# Patient Record
Sex: Female | Born: 1960 | Race: White | Hispanic: No | Marital: Married | Smoking: Never smoker
Health system: Southern US, Community
[De-identification: ages and names within clinical notes are randomized; demographics above are authoritative.]

## PROBLEM LIST (undated history)

## (undated) DIAGNOSIS — E7800 Pure hypercholesterolemia, unspecified: Secondary | ICD-10-CM

## (undated) DIAGNOSIS — I10 Essential (primary) hypertension: Secondary | ICD-10-CM

## (undated) DIAGNOSIS — E785 Hyperlipidemia, unspecified: Secondary | ICD-10-CM

## (undated) DIAGNOSIS — IMO0002 Reserved for concepts with insufficient information to code with codable children: Secondary | ICD-10-CM

## (undated) DIAGNOSIS — H9190 Unspecified hearing loss, unspecified ear: Secondary | ICD-10-CM

## (undated) DIAGNOSIS — M8580 Other specified disorders of bone density and structure, unspecified site: Secondary | ICD-10-CM

## (undated) HISTORY — PX: COCHLEAR IMPLANT: SUR684

## (undated) HISTORY — DX: Essential (primary) hypertension: I10

## (undated) HISTORY — DX: Hyperlipidemia, unspecified: E78.5

---

## 2007-01-16 HISTORY — PX: PLACEMENT OF BREAST IMPLANTS: SHX6334

## 2010-02-21 ENCOUNTER — Other Ambulatory Visit (HOSPITAL_COMMUNITY): Payer: Self-pay | Admitting: Obstetrics and Gynecology

## 2010-02-21 DIAGNOSIS — R922 Inconclusive mammogram: Secondary | ICD-10-CM

## 2010-02-21 DIAGNOSIS — Z803 Family history of malignant neoplasm of breast: Secondary | ICD-10-CM

## 2010-07-18 ENCOUNTER — Other Ambulatory Visit: Payer: Self-pay | Admitting: Obstetrics and Gynecology

## 2010-07-18 DIAGNOSIS — R922 Inconclusive mammogram: Secondary | ICD-10-CM

## 2010-08-17 ENCOUNTER — Ambulatory Visit
Admission: RE | Admit: 2010-08-17 | Discharge: 2010-08-17 | Disposition: A | Discharge status: Home / Self Care | Payer: 59 | Source: Ambulatory Visit | Attending: Obstetrics and Gynecology | Admitting: Obstetrics and Gynecology

## 2010-08-17 DIAGNOSIS — R922 Inconclusive mammogram: Secondary | ICD-10-CM

## 2010-08-17 DIAGNOSIS — Z803 Family history of malignant neoplasm of breast: Secondary | ICD-10-CM

## 2010-08-17 MED ORDER — GADOBENATE DIMEGLUMINE 529 MG/ML IV SOLN
10.0000 mL | Freq: Once | INTRAVENOUS | Status: AC | PRN
  Administered 2010-08-17: 10 mL via INTRAVENOUS

## 2011-05-17 ENCOUNTER — Ambulatory Visit (HOSPITAL_COMMUNITY)
Admission: RE | Admit: 2011-05-17 | Discharge: 2011-05-17 | Disposition: A | Discharge status: Home / Self Care | Payer: 59 | Source: Ambulatory Visit | Attending: Internal Medicine | Admitting: Internal Medicine

## 2011-05-17 ENCOUNTER — Encounter (HOSPITAL_COMMUNITY): Payer: Self-pay

## 2011-05-17 ENCOUNTER — Other Ambulatory Visit (HOSPITAL_COMMUNITY): Payer: Self-pay | Admitting: *Deleted

## 2011-05-17 DIAGNOSIS — M810 Age-related osteoporosis without current pathological fracture: Secondary | ICD-10-CM | POA: Insufficient documentation

## 2011-05-17 HISTORY — DX: Other specified disorders of bone density and structure, unspecified site: M85.80

## 2011-05-17 HISTORY — DX: Reserved for concepts with insufficient information to code with codable children: IMO0002

## 2011-05-17 MED ORDER — ZOLEDRONIC ACID 5 MG/100ML IV SOLN
5.0000 mg | Freq: Once | INTRAVENOUS | Status: AC
  Administered 2011-05-17: 5 mg via INTRAVENOUS
  Filled 2011-05-17: qty 100

## 2011-05-17 MED ORDER — SODIUM CHLORIDE 0.9 % IV SOLN
Freq: Once | INTRAVENOUS | Status: AC
Start: 2011-05-17 — End: 2011-05-17
  Administered 2011-05-17: 15:00:00 via INTRAVENOUS

## 2011-05-17 NOTE — Progress Notes (Signed)
Tolerated first Reclast dose well. Verbalizes understanding of d/c instructions.

## 2011-05-17 NOTE — Discharge Instructions (Signed)
Reclast Drink plenty of water the next few days. If any aches/pains take what you normally would for discomfort. Continue taking your vitamin D/calcium as directed by your MD. Call your MD if any problems or questions.    Zoledronic Acid injection (Paget's Disease, Osteoporosis) What is this medicine? ZOLEDRONIC ACID (ZOE le dron ik AS id) lowers the amount of calcium loss from bone. It is used to treat Paget's disease and osteoporosis in women. This medicine may be used for other purposes; ask your health care provider or pharmacist if you have questions. What should I tell my health care provider before I take this medicine? They need to know if you have any of these conditions: -aspirin-sensitive asthma -dental disease -kidney disease -low levels of calcium in the blood -past surgery on the parathyroid gland or intestines -an unusual or allergic reaction to zoledronic acid, other medicines, foods, dyes, or preservatives -pregnant or trying to get pregnant -breast-feeding How should I use this medicine? This medicine is for infusion into a vein. It is given by a health care professional in a hospital or clinic setting. Talk to your pediatrician regarding the use of this medicine in children. This medicine is not approved for use in children. Overdosage: If you think you have taken too much of this medicine contact a poison control center or emergency room at once. NOTE: This medicine is only for you. Do not share this medicine with others. What if I miss a dose? It is important not to miss your dose. Call your doctor or health care professional if you are unable to keep an appointment. What may interact with this medicine? -certain antibiotics given by injection -NSAIDs, medicines for pain and inflammation, like ibuprofen or naproxen -some diuretics like bumetanide, furosemide -teriparatide This list may not describe all possible interactions. Give your health care provider a list of  all the medicines, herbs, non-prescription drugs, or dietary supplements you use. Also tell them if you smoke, drink alcohol, or use illegal drugs. Some items may interact with your medicine. What should I watch for while using this medicine? Visit your doctor or health care professional for regular checkups. It may be some time before you see the benefit from this medicine. Do not stop taking your medicine unless your doctor tells you to. Your doctor may order blood tests or other tests to see how you are doing. Women should inform their doctor if they wish to become pregnant or think they might be pregnant. There is a potential for serious side effects to an unborn child. Talk to your health care professional or pharmacist for more information. You should make sure that you get enough calcium and vitamin D while you are taking this medicine. Discuss the foods you eat and the vitamins you take with your health care professional. Some people who take this medicine have severe bone, joint, and/or muscle pain. This medicine may also increase your risk for a broken thigh bone. Tell your doctor right away if you have pain in your upper leg or groin. Tell your doctor if you have any pain that does not go away or that gets worse. What side effects may I notice from receiving this medicine? Side effects that you should report to your doctor or health care professional as soon as possible: -allergic reactions like skin rash, itching or hives, swelling of the face, lips, or tongue -breathing problems -changes in vision -feeling faint or lightheaded, falls -jaw burning, cramping, or pain -muscle cramps, stiffness, or  weakness -trouble passing urine or change in the amount of urine Side effects that usually do not require medical attention (report to your doctor or health care professional if they continue or are bothersome): -bone, joint, or muscle pain -fever -irritation at site where injected -loss of  appetite -nausea, vomiting -stomach upset -tired This list may not describe all possible side effects. Call your doctor for medical advice about side effects. You may report side effects to FDA at 1-800-FDA-1088. Where should I keep my medicine? This drug is given in a hospital or clinic and will not be stored at home. NOTE: This sheet is a summary. It may not cover all possible information. If you have questions about this medicine, talk to your doctor, pharmacist, or health care provider.  2012, Elsevier/Gold Standard. (06/30/2010 9:08:15 AM)

## 2012-04-22 ENCOUNTER — Other Ambulatory Visit: Payer: Self-pay | Admitting: Obstetrics and Gynecology

## 2012-04-22 DIAGNOSIS — Z803 Family history of malignant neoplasm of breast: Secondary | ICD-10-CM

## 2012-05-07 ENCOUNTER — Ambulatory Visit
Admission: RE | Admit: 2012-05-07 | Discharge: 2012-05-07 | Disposition: A | Discharge status: Home / Self Care | Payer: 59 | Source: Ambulatory Visit | Attending: Obstetrics and Gynecology | Admitting: Obstetrics and Gynecology

## 2012-05-07 DIAGNOSIS — Z803 Family history of malignant neoplasm of breast: Secondary | ICD-10-CM

## 2012-05-07 MED ORDER — GADOBENATE DIMEGLUMINE 529 MG/ML IV SOLN
9.0000 mL | Freq: Once | INTRAVENOUS | Status: AC | PRN
  Administered 2012-05-07: 9 mL via INTRAVENOUS

## 2012-05-26 MED ORDER — ZOLEDRONIC ACID 5 MG/100ML IV SOLN: 5.0000 mg | Freq: Once | INTRAVENOUS | Status: DC

## 2012-05-27 ENCOUNTER — Ambulatory Visit (HOSPITAL_COMMUNITY)
Admission: RE | Admit: 2012-05-27 | Discharge: 2012-05-27 | Disposition: A | Discharge status: Home / Self Care | Payer: 59 | Source: Ambulatory Visit | Attending: Internal Medicine | Admitting: Internal Medicine

## 2012-05-27 ENCOUNTER — Encounter (HOSPITAL_COMMUNITY): Payer: Self-pay

## 2012-05-27 DIAGNOSIS — M949 Disorder of cartilage, unspecified: Secondary | ICD-10-CM | POA: Insufficient documentation

## 2012-05-27 DIAGNOSIS — M899 Disorder of bone, unspecified: Secondary | ICD-10-CM | POA: Insufficient documentation

## 2012-05-27 DIAGNOSIS — M810 Age-related osteoporosis without current pathological fracture: Secondary | ICD-10-CM | POA: Insufficient documentation

## 2012-05-27 HISTORY — DX: Pure hypercholesterolemia, unspecified: E78.00

## 2012-05-27 MED ORDER — ZOLEDRONIC ACID 5 MG/100ML IV SOLN
5.0000 mg | Freq: Once | INTRAVENOUS | Status: AC
  Administered 2012-05-27: 5 mg via INTRAVENOUS
  Filled 2012-05-27: qty 100

## 2012-05-27 MED ORDER — SODIUM CHLORIDE 0.9 % IV SOLN
250.0000 mL | Freq: Once | INTRAVENOUS | Status: AC
  Administered 2012-05-27: 250 mL via INTRAVENOUS

## 2012-11-14 ENCOUNTER — Ambulatory Visit (INDEPENDENT_AMBULATORY_CARE_PROVIDER_SITE_OTHER): Payer: 59

## 2012-11-14 VITALS — BP 119/78 | HR 72 | Resp 12 | Ht <= 58 in | Wt 115.0 lb

## 2012-11-14 DIAGNOSIS — L03039 Cellulitis of unspecified toe: Secondary | ICD-10-CM

## 2012-11-14 DIAGNOSIS — M79609 Pain in unspecified limb: Secondary | ICD-10-CM

## 2012-11-14 DIAGNOSIS — M2010 Hallux valgus (acquired), unspecified foot: Secondary | ICD-10-CM

## 2012-11-14 DIAGNOSIS — L600 Ingrowing nail: Secondary | ICD-10-CM

## 2012-11-14 MED ORDER — CEPHALEXIN 500 MG PO CAPS: 500.0000 mg | ORAL_CAPSULE | Freq: Three times a day (TID) | ORAL | Status: DC

## 2012-11-14 NOTE — Progress Notes (Signed)
Subjective:    Patient ID: Christina Vargas, female    DOB: [birth year], 52 y.o.   MRN: [id number]  HPI Comments: '' RT FOOT GREAT TOENAIL IS SORE FOR 2 WEEKS''  patient has several problems besides in ingrowing right great toenail medial lateral border. Patient also has notable bunion deformity bilateral right more significant than left. In the past patient was told that she should have bunion surgery as it was unsuccessful. Are patient is starting to have some discomfort as a result of her bunions. Also recently seen by dermatologist and started on oral antifungal medications for the severe onychomycosis and deformed nails she has bilateral.    Review of Systems  Constitutional: Negative.   HENT: Negative.   Eyes: Negative.   Respiratory: Negative.   Cardiovascular: Negative.   Gastrointestinal: Negative.   Endocrine: Negative.   Genitourinary: Negative.   Musculoskeletal: Negative.   Skin: Negative.   Allergic/Immunologic: Negative.   Neurological: Negative.   Hematological: Negative.   Psychiatric/Behavioral: Negative.        Objective:   Physical Exam  Vitals reviewed. Constitutional: She is oriented to person, place, and time. She appears well-developed and well-nourished.  Cardiovascular:  Pulses:      Dorsalis pedis pulses are 2+ on the right side, and 2+ on the left side.       Posterior tibial pulses are 2+ on the right side, and 2+ on the left side.  Capillary refill time 3 seconds all digits bilateral skin temperature warm turgor normal no edema noted mild varicosities noted bilateral.  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  Neurological: She is alert and oriented to person, place, and time. She has normal strength and normal reflexes.   Epicritic and proprioceptive sensations intact and symmetric bilateral. Normal plantar response and DTRs are noted.  Skin: Skin is warm and dry. No cyanosis. Nails show no clubbing.  Skin color and pigment normal hair growth absent bilateral nails extremely thickened yellow friable and discolored with cryptitis and brittleness. Patient is significant incurvation of the right hallux nail with edema and erythema the borders and tenderness on palpation consistent with early paronychia. Patient also keratoses pinch callus and first MTP area bilateral  Psychiatric: She has a normal mood and affect. Her behavior is normal.          Assessment & Plan:  Assessments at this time #1 ingrowing nail right hallux with onychomycosis and early paronychia right great toe. Plan at this time is for AP nail procedure with phenol matricectomy. Local anesthetic block is administered total of 3 cc 50-50 mixture of 2% Xylocaine plain and 0.5% Marcaine plain in a digital block fashion hemostasis was applied obtained with the Coflex wrap. The medial and lateral borders were excised phenol matricectomy applied x3 to each border followed by alcohol wash Silvadene cream and a gauze dressing being applied. Patient is given instructions for daily Betadine warm water soak prescription for cephalexin 500 mg 3 times a day x10 days recommended Tylenol or Advil as needed for pain. Recheck in 2-3 weeks for followup for nail check.  Bunion deformity bilateral: Patient does have notable bunion deformities with some dorsal displacement second digits and possible early capsulitis symptomology. Dispensed literature for future consideration of surgical correction. Patient is advised earlier correction may prevent degenerative changes from developing an increase your  success rate. The leg surgery increase his risk of arthritis development and reduces the success rate of surgical intervention.  Patient was also requested debridement of the  remaining mycotic nails which were provided debridement at this time also suggested topical Fungi-Nail to the affected nails daily which can be obtained over-the-counter. Patient may also be candidate for a Nuvail, or topical urea preparations for her nails to assist in improving the nail dystrophy during her oral treatment as well.  Patient will followup to 3 weeks for further discussions of nail treatment and bunion options.  Alvan Dame DPM

## 2012-11-14 NOTE — Patient Instructions (Signed)

## 2012-12-05 ENCOUNTER — Ambulatory Visit (INDEPENDENT_AMBULATORY_CARE_PROVIDER_SITE_OTHER): Payer: 59

## 2012-12-05 ENCOUNTER — Ambulatory Visit: Payer: 59

## 2012-12-05 VITALS — BP 107/68 | HR 83 | Resp 18

## 2012-12-05 DIAGNOSIS — L600 Ingrowing nail: Secondary | ICD-10-CM

## 2012-12-05 DIAGNOSIS — M79609 Pain in unspecified limb: Secondary | ICD-10-CM

## 2012-12-05 DIAGNOSIS — Z09 Encounter for follow-up examination after completed treatment for conditions other than malignant neoplasm: Secondary | ICD-10-CM

## 2012-12-05 NOTE — Progress Notes (Signed)
  Subjective:    Patient ID: Christina Vargas, female    DOB: 05-29-1960, 52 y.o.   MRN: [id number]  HPImy nail is good on my right big toenail and is a little red and puffy and my bunion hurts with my heels    Review of Systems     Objective:   Physical Exam No new findings the nail borders mediolateral have slight eschar no discharge or drainage no pain slight erythema noted consistent with postop course assessment good postop progress at this time patient discharged from our care with followup in the future to further discuss her bunion problems on an as-needed basis.       Assessment & Plan:  Good postop progress following AP nail procedure right great toe patient discharged from our care at this time good resolution following AP nail eschar should followup with the next month. Next  Alvan Dame DPM

## 2012-12-05 NOTE — Patient Instructions (Signed)

## 2013-01-05 ENCOUNTER — Other Ambulatory Visit: Payer: Self-pay | Admitting: Obstetrics and Gynecology

## 2013-01-05 DIAGNOSIS — Z803 Family history of malignant neoplasm of breast: Secondary | ICD-10-CM

## 2013-01-20 ENCOUNTER — Ambulatory Visit
Admission: RE | Admit: 2013-01-20 | Discharge: 2013-01-20 | Disposition: A | Discharge status: Home / Self Care | Payer: 59 | Source: Ambulatory Visit | Attending: Obstetrics and Gynecology | Admitting: Obstetrics and Gynecology

## 2013-01-20 DIAGNOSIS — Z803 Family history of malignant neoplasm of breast: Secondary | ICD-10-CM

## 2013-01-20 MED ORDER — GADOBENATE DIMEGLUMINE 529 MG/ML IV SOLN
10.0000 mL | Freq: Once | INTRAVENOUS | Status: AC | PRN
  Administered 2013-01-20: 10 mL via INTRAVENOUS

## 2014-01-14 ENCOUNTER — Telehealth: Payer: Self-pay | Admitting: Genetic Counselor

## 2014-01-14 NOTE — Telephone Encounter (Signed)
S/W PATIENT AND GAVE GENETIC APPT FOR 01/07 @ 1:30 W/GENETIC COUNSELOR

## 2014-01-21 ENCOUNTER — Other Ambulatory Visit: Payer: Managed Care, Other (non HMO)

## 2014-01-21 ENCOUNTER — Ambulatory Visit (HOSPITAL_BASED_OUTPATIENT_CLINIC_OR_DEPARTMENT_OTHER): Payer: Managed Care, Other (non HMO) | Admitting: Genetic Counselor

## 2014-01-21 DIAGNOSIS — Z806 Family history of leukemia: Secondary | ICD-10-CM

## 2014-01-21 DIAGNOSIS — Z803 Family history of malignant neoplasm of breast: Secondary | ICD-10-CM | POA: Insufficient documentation

## 2014-01-21 DIAGNOSIS — Z315 Encounter for genetic counseling: Secondary | ICD-10-CM

## 2014-01-21 DIAGNOSIS — Z8041 Family history of malignant neoplasm of ovary: Secondary | ICD-10-CM | POA: Insufficient documentation

## 2014-01-21 NOTE — Progress Notes (Signed)
[facility] New Patient Visit  REFERRING PROVIDER: Tivis Ringer, MD [address] [place], [place] [address]  PRIMARY PROVIDER:  Tivis Ringer, MD  PRIMARY REASON FOR VISIT:  Family history of breast cancer  HISTORY OF PRESENT ILLNESS:   Christina Vargas, a 54 y.o. female, was seen for a [place] cancer genetics consultation at the request of Dr. Dagmar Hait due to a family history of cancer.  Ms. Clement Husbands presents to clinic today to discuss the possibility of a hereditary predisposition to cancer, genetic testing, and to further clarify her future cancer risks, as well as potential cancer risks for family members. She has no personal history of cancer and is in good general health.   HORMONAL RISK FACTORS:  Menarche was at age 50 .  First live birth at age 31.  OCP use for approximately 0 years.  Ovaries intact: yes.  Hysterectomy: no.  Menopausal status: perimenopausal.  HRT use: 0 years.  CANCER SCREENING:  Colonoscopy: yes; normal. Mammogram within the last year: yes. Number of breast biopsies: 0. Up to date with pelvic exams:  yes.  Past Medical History  Diagnosis Date   Degenerative disk disease    Hypercholesteremia    Osteopenia     Past Surgical History  Procedure Laterality Date   Cochlear implant      left ear    History   Social History   Marital Status: Married    Spouse Name: N/A    Number of Children: N/A   Years of Education: N/A   Social History Main Topics   Smoking status: Never Smoker    Smokeless tobacco: Not on file   Alcohol Use: Yes   Drug Use: No   Sexual Activity: Not on file   Other Topics Concern   Not on file   Social History Narrative     FAMILY HISTORY:  During the visit, a 4-generation pedigree was obtained. A copy of the pedigree with be scanned into Epic under the Media tab. Significant family history diagnoses include the following: Family History  Problem Relation Age  of Onset   Cancer Mother 47    breast   Cancer Sister 68    dysplastic nevi on leg   Cancer Brother 60    dysplastic nevi or basal cell on back   Cancer Maternal Aunt 70    breast   Cancer Sister 19    chondrosarcoma   Cancer Other     niece with possible thyroid ca and nephew with brain tumor ? cancerous (these are siblings related to patient through patient's sister with chondrosarcoma)   Cancer Other     two first cousins once removed with leukemia in their 17s (related to patient through mat aunt with breast cancer)   [name] ancestry is of [place] descent on her maternal side of the family and Ashkenazi Jewish on her paternal side of the family.   GENETIC COUNSELING ASSESSMENT:  Ms. Clement Husbands is a 54 y.o. female with a family history of cancer suggestive of a hereditary predisposition to cancer. We, therefore, discussed and recommended the following at today's visit.   DISCUSSION:  We reviewed the characteristics, features and inheritance patterns of hereditary cancer syndromes. We also discussed genetic testing, including the appropriate family members to test, the process of testing, insurance coverage and turn-around-time for results. We discussed the implications of a negative, positive and/or variant of uncertain significant result. Given the extensive and varied family history of cancer  on the maternal side of the family, we recommended [name] pursue genetic testing for the CancerNext-Expanded gene panel.   PLAN:  Based on our above recommendation, Ms. Clement Husbands wished to pursue genetic testing and the blood sample was drawn and will be sent to [facility] for analysis. Results should be available within approximately 6 weeks time, at which point they will be disclosed by telephone to [name], as will any additional recommendations warranted by these results. Lastly, we encouraged [name] to remain in contact with cancer genetics annually so that we  can continuously update the family history and inform her of any changes in cancer genetics and testing that may be of benefit for this family.   Ms.  Inetta Fermo questions were answered to her satisfaction today. Our contact information was provided should additional questions or concerns arise. Thank you for the referral and allowing [place] to share in the care of your patient.   Catherine A. Fine, MS, CGC Certified Psychologist, [email or web address] phone: [phone]  The patient was seen for a total of 55 minutes in face-to-face genetic counseling.  This patient was discussed with Dr. Jana Hakim who agrees with the above.    ______________________________________________________________________ For Office Staff:  Number of people involved in session including genetic counselor: 2 Was an intern or student involved with case: not applicable

## 2014-02-17 ENCOUNTER — Encounter: Payer: Self-pay | Admitting: Genetic Counselor

## 2014-02-17 DIAGNOSIS — Z803 Family history of malignant neoplasm of breast: Secondary | ICD-10-CM

## 2014-02-17 DIAGNOSIS — Z8041 Family history of malignant neoplasm of ovary: Secondary | ICD-10-CM

## 2014-02-17 NOTE — Progress Notes (Signed)
[facility]  Genetic Test Results     PRIMARY PROVIDER:  Tivis Ringer, MD  PRIMARY REASON FOR VISIT:  Family history of breast cancer  GENETIC TEST RESULT:  Testing Laboratory: [facility]  Test Ordered: CancerNext-Expanded gene panel Date of Report: 02/16/14 Result: Normal, no pathogenic mutations identified General Interpretation: Reassuring  HPI: Christina Vargas was previously seen in the [facility] due to concerns regarding a hereditary predisposition to cancer. Please refer to our prior cancer genetics clinic note for more information regarding Christina Vargas's medical, social and family histories, and our assessment and recommendations, at the time. Christina Vargas genetic test results and recommendations warranted by these results were recently disclosed to her and are discussed in more detail below.  GENETIC TEST RESULTS: At the time of Christina Vargas's visit, we recommended she pursue genetic testing, which includes sequencing and deletion/duplication analysis of several genes associated with an increased risk for cancer via a gene panel. The CancerNext-Expanded gene panel offered by [facility] analyzes the following 49 genes: APC, ATM, BAP1, BARD1, BMPR1A, BRCA1, BRCA2, BRIP1, CDH1, CDK4, CDKN2A, CHEK2, EPCAM, FH, FLCN, GREM1, MAX, MEN1, MITF, MET, MLH1, MRE11A, MSH2, MSH6, MUTYH, NBN, NF1, PALB2, PMS2, POLD1, POLE, PTEN, RAD50, RAD51C, RAD51D, RET, SDHA, SDHAF2, SDHB, SDHC, SDHD, SMAD4, SMARCA4, STK11, TMEM127, TP53, TSC1, TSC2, and VHL. Genetic testing for this gene panel was normal and did not reveal a pathogenic mutation in any of these genes. A copy of the genetic test report will be scanned into Epic under the media tab.   CANCER SCREENING RECOMMENDATIONS: This normal result is reassuring and indicates that Christina Vargas does not likely have an increased risk of cancer due to a mutation in one of these genes.  We,  therefore, recommended  Christina Vargas continue to follow the cancer screening guidelines provided by her primary healthcare providers.   RECOMMENDATIONS FOR FAMILY MEMBERS:  While these results are reassuring for Christina Vargas, this test does not tell us anything about Christina Vargas's siblings' or maternal realtives' risks. We recommended these relatives also have genetic counseling and testing if they have not already done so, and are happy to help facilitate testing and/or a referral if needed. Cancer genetic counselors can also be located, by visiting the website of the [employer] ([email or web address]) and Field seismologist for a Oncologist by zip code.    FOLLOW-UP: Lastly, we discussed with Christina Vargas that cancer genetics is a rapidly advancing field and it is likely that new genetic tests will be appropriate for her and/or family members in the future. We encouraged her to remain in contact with cancer genetics on an annual basis so we can update her personal and family histories and let her know of advances in cancer genetics that may benefit this family.   Our contact number was provided. Christina Vargas questions were answered to her satisfaction, and she knows she is welcome to call us at anytime with additional questions or concerns.    Catherine A. Fine, MS, CGC Certified Psychologist, [email or web address] Phone: [phone]

## 2014-06-21 ENCOUNTER — Ambulatory Visit (HOSPITAL_COMMUNITY)
Admission: RE | Admit: 2014-06-21 | Discharge: 2014-06-21 | Disposition: A | Discharge status: Home / Self Care | Payer: 59 | Source: Ambulatory Visit | Attending: Internal Medicine | Admitting: Internal Medicine

## 2014-06-21 ENCOUNTER — Encounter (HOSPITAL_COMMUNITY): Payer: Self-pay

## 2014-06-21 DIAGNOSIS — M810 Age-related osteoporosis without current pathological fracture: Secondary | ICD-10-CM | POA: Diagnosis not present

## 2014-06-21 MED ORDER — SODIUM CHLORIDE 0.9 % IV SOLN
250.0000 mL | Freq: Once | INTRAVENOUS | Status: AC
  Administered 2014-06-21: 250 mL via INTRAVENOUS

## 2014-06-21 MED ORDER — ZOLEDRONIC ACID 5 MG/100ML IV SOLN
5.0000 mg | Freq: Once | INTRAVENOUS | Status: AC
  Administered 2014-06-21: 5 mg via INTRAVENOUS
  Filled 2014-06-21: qty 100

## 2014-06-21 NOTE — Discharge Instructions (Signed)
RECLAST °Zoledronic Acid injection (Paget's Disease, Osteoporosis) °What is this medicine? °ZOLEDRONIC ACID (ZOE le dron ik AS id) lowers the amount of calcium loss from bone. It is used to treat Paget's disease and osteoporosis in women. °This medicine may be used for other purposes; ask your health care provider or pharmacist if you have questions. °COMMON BRAND NAME(S): Reclast, Zometa °What should I tell my health care provider before I take this medicine? °They need to know if you have any of these conditions: °-aspirin-sensitive asthma °-cancer, especially if you are receiving medicines used to treat cancer °-dental disease or wear dentures °-infection °-kidney disease °-low levels of calcium in the blood °-past surgery on the parathyroid gland or intestines °-receiving corticosteroids like dexamethasone or prednisone °-an unusual or allergic reaction to zoledronic acid, other medicines, foods, dyes, or preservatives °-pregnant or trying to get pregnant °-breast-feeding °How should I use this medicine? °This medicine is for infusion into a vein. It is given by a health care professional in a hospital or clinic setting. °Talk to your pediatrician regarding the use of this medicine in children. This medicine is not approved for use in children. °Overdosage: If you think you have taken too much of this medicine contact a poison control center or emergency room at once. °NOTE: This medicine is only for you. Do not share this medicine with others. °What if I miss a dose? °It is important not to miss your dose. Call your doctor or health care professional if you are unable to keep an appointment. °What may interact with this medicine? °-certain antibiotics given by injection °-NSAIDs, medicines for pain and inflammation, like ibuprofen or naproxen °-some diuretics like bumetanide, furosemide °-teriparatide °This list may not describe all possible interactions. Give your health care provider a list of all the  medicines, herbs, non-prescription drugs, or dietary supplements you use. Also tell them if you smoke, drink alcohol, or use illegal drugs. Some items may interact with your medicine. °What should I watch for while using this medicine? °Visit your doctor or health care professional for regular checkups. It may be some time before you see the benefit from this medicine. Do not stop taking your medicine unless your doctor tells you to. Your doctor may order blood tests or other tests to see how you are doing. °Women should inform their doctor if they wish to become pregnant or think they might be pregnant. There is a potential for serious side effects to an unborn child. Talk to your health care professional or pharmacist for more information. °You should make sure that you get enough calcium and vitamin D while you are taking this medicine. Discuss the foods you eat and the vitamins you take with your health care professional. °Some people who take this medicine have severe bone, joint, and/or muscle pain. This medicine may also increase your risk for jaw problems or a broken thigh bone. Tell your doctor right away if you have severe pain in your jaw, bones, joints, or muscles. Tell your doctor if you have any pain that does not go away or that gets worse. °Tell your dentist and dental surgeon that you are taking this medicine. You should not have major dental surgery while on this medicine. See your dentist to have a dental exam and fix any dental problems before starting this medicine. Take good care of your teeth while on this medicine. Make sure you see your dentist for regular follow-up appointments. °What side effects may I notice from receiving this   medicine? Side effects that you should report to your doctor or health care professional as soon as possible: -allergic reactions like skin rash, itching or hives, swelling of the face, lips, or tongue -anxiety, confusion, or depression -breathing  problems -changes in vision -eye pain -feeling faint or lightheaded, falls -jaw pain, especially after dental work -mouth sores -muscle cramps, stiffness, or weakness -trouble passing urine or change in the amount of urine Side effects that usually do not require medical attention (report to your doctor or health care professional if they continue or are bothersome): -bone, joint, or muscle pain -constipation -diarrhea -fever -hair loss -irritation at site where injected -loss of appetite -nausea, vomiting -stomach upset -trouble sleeping -trouble swallowing -weak or tired This list may not describe all possible side effects. Call your doctor for medical advice about side effects. You may report side effects to FDA at 1-800-FDA-1088. Where should I keep my medicine? This drug is given in a hospital or clinic and will not be stored at home. NOTE: This sheet is a summary. It may not cover all possible information. If you have questions about this medicine, talk to your doctor, pharmacist, or health care provider.  2015, Elsevier/Gold Standard. (2012-06-16 10:03:48)

## 2015-08-05 ENCOUNTER — Ambulatory Visit (HOSPITAL_COMMUNITY): Payer: Managed Care, Other (non HMO)

## 2016-01-04 ENCOUNTER — Ambulatory Visit (HOSPITAL_COMMUNITY)
Admission: RE | Admit: 2016-01-04 | Discharge: 2016-01-04 | Disposition: A | Discharge status: Home / Self Care | Payer: 59 | Source: Ambulatory Visit | Attending: Internal Medicine | Admitting: Internal Medicine

## 2016-01-04 ENCOUNTER — Encounter (HOSPITAL_COMMUNITY): Payer: Self-pay

## 2016-01-04 DIAGNOSIS — M810 Age-related osteoporosis without current pathological fracture: Secondary | ICD-10-CM | POA: Insufficient documentation

## 2016-01-04 HISTORY — DX: Unspecified hearing loss, unspecified ear: H91.90

## 2016-01-04 MED ORDER — SODIUM CHLORIDE 0.9 % IV SOLN
Freq: Once | INTRAVENOUS | Status: AC
  Administered 2016-01-04: 11:00:00 via INTRAVENOUS

## 2016-01-04 MED ORDER — ZOLEDRONIC ACID 5 MG/100ML IV SOLN
5.0000 mg | Freq: Once | INTRAVENOUS | Status: AC
  Administered 2016-01-04: 5 mg via INTRAVENOUS
  Filled 2016-01-04: qty 100

## 2016-01-04 NOTE — Discharge Instructions (Signed)
Zoledronic Acid injection (Paget's Disease, Osteoporosis) °What is this medicine? °ZOLEDRONIC ACID (ZOE le dron ik AS id) lowers the amount of calcium loss from bone. It is used to treat Paget's disease and osteoporosis in women. °COMMON BRAND NAME(S): Reclast, Zometa °What should I tell my health care provider before I take this medicine? °They need to know if you have any of these conditions: °-aspirin-sensitive asthma °-cancer, especially if you are receiving medicines used to treat cancer °-dental disease or wear dentures °-infection °-kidney disease °-low levels of calcium in the blood °-past surgery on the parathyroid gland or intestines °-receiving corticosteroids like dexamethasone or prednisone °-an unusual or allergic reaction to zoledronic acid, other medicines, foods, dyes, or preservatives °-pregnant or trying to get pregnant °-breast-feeding °How should I use this medicine? °This medicine is for infusion into a vein. It is given by a health care professional in a hospital or clinic setting. °Talk to your pediatrician regarding the use of this medicine in children. This medicine is not approved for use in children. °What if I miss a dose? °It is important not to miss your dose. Call your doctor or health care professional if you are unable to keep an appointment. °What may interact with this medicine? °-certain antibiotics given by injection °-NSAIDs, medicines for pain and inflammation, like ibuprofen or naproxen °-some diuretics like bumetanide, furosemide °-teriparatide °What should I watch for while using this medicine? °Visit your doctor or health care professional for regular checkups. It may be some time before you see the benefit from this medicine. Do not stop taking your medicine unless your doctor tells you to. Your doctor may order blood tests or other tests to see how you are doing. °Women should inform their doctor if they wish to become pregnant or think they might be pregnant. There is a  potential for serious side effects to an unborn child. Talk to your health care professional or pharmacist for more information. °You should make sure that you get enough calcium and vitamin D while you are taking this medicine. Discuss the foods you eat and the vitamins you take with your health care professional. °Some people who take this medicine have severe bone, joint, and/or muscle pain. This medicine may also increase your risk for jaw problems or a broken thigh bone. Tell your doctor right away if you have severe pain in your jaw, bones, joints, or muscles. Tell your doctor if you have any pain that does not go away or that gets worse. °Tell your dentist and dental surgeon that you are taking this medicine. You should not have major dental surgery while on this medicine. See your dentist to have a dental exam and fix any dental problems before starting this medicine. Take good care of your teeth while on this medicine. Make sure you see your dentist for regular follow-up appointments. °What side effects may I notice from receiving this medicine? °Side effects that you should report to your doctor or health care professional as soon as possible: °-allergic reactions like skin rash, itching or hives, swelling of the face, lips, or tongue °-anxiety, confusion, or depression °-breathing problems °-changes in vision °-eye pain °-feeling faint or lightheaded, falls °-jaw pain, especially after dental work °-mouth sores °-muscle cramps, stiffness, or weakness °-redness, blistering, peeling or loosening of the skin, including inside the mouth °-trouble passing urine or change in the amount of urine °Side effects that usually do not require medical attention (report to your doctor or health care professional if   they continue or are bothersome): °-bone, joint, or muscle pain °-constipation °-diarrhea °-fever °-hair loss °-irritation at site where injected °-loss of appetite °-nausea, vomiting °-stomach  upset °-trouble sleeping °-trouble swallowing °-weak or tired °Where should I keep my medicine? °This drug is given in a hospital or clinic and will not be stored at home. °© 2017 Elsevier/Gold Standard (2013-05-30 14:19:57) ° °

## 2016-02-06 ENCOUNTER — Other Ambulatory Visit: Payer: Self-pay | Admitting: Obstetrics and Gynecology

## 2016-02-06 DIAGNOSIS — R928 Other abnormal and inconclusive findings on diagnostic imaging of breast: Secondary | ICD-10-CM

## 2016-02-08 ENCOUNTER — Ambulatory Visit
Admission: RE | Admit: 2016-02-08 | Discharge: 2016-02-08 | Disposition: A | Discharge status: Home / Self Care | Payer: 59 | Source: Ambulatory Visit | Attending: Obstetrics and Gynecology | Admitting: Obstetrics and Gynecology

## 2016-02-08 DIAGNOSIS — R928 Other abnormal and inconclusive findings on diagnostic imaging of breast: Secondary | ICD-10-CM

## 2016-03-21 DIAGNOSIS — J019 Acute sinusitis, unspecified: Secondary | ICD-10-CM | POA: Diagnosis not present

## 2016-03-21 DIAGNOSIS — Z6826 Body mass index (BMI) 26.0-26.9, adult: Secondary | ICD-10-CM | POA: Diagnosis not present

## 2016-03-21 DIAGNOSIS — R05 Cough: Secondary | ICD-10-CM | POA: Diagnosis not present

## 2016-04-18 ENCOUNTER — Encounter: Payer: Self-pay | Admitting: Podiatry

## 2016-04-18 ENCOUNTER — Telehealth: Payer: Self-pay | Admitting: *Deleted

## 2016-04-18 ENCOUNTER — Ambulatory Visit (INDEPENDENT_AMBULATORY_CARE_PROVIDER_SITE_OTHER): Payer: BLUE CROSS/BLUE SHIELD

## 2016-04-18 ENCOUNTER — Ambulatory Visit (INDEPENDENT_AMBULATORY_CARE_PROVIDER_SITE_OTHER): Payer: BLUE CROSS/BLUE SHIELD | Admitting: Podiatry

## 2016-04-18 VITALS — BP 109/72 | HR 71

## 2016-04-18 DIAGNOSIS — M779 Enthesopathy, unspecified: Secondary | ICD-10-CM | POA: Diagnosis not present

## 2016-04-18 DIAGNOSIS — D3610 Benign neoplasm of peripheral nerves and autonomic nervous system, unspecified: Secondary | ICD-10-CM | POA: Diagnosis not present

## 2016-04-18 DIAGNOSIS — R52 Pain, unspecified: Secondary | ICD-10-CM

## 2016-04-18 MED ORDER — MELOXICAM 15 MG PO TABS: 15.0000 mg | ORAL_TABLET | Freq: Every day | ORAL | 0 refills | Status: DC

## 2016-04-18 NOTE — Addendum Note (Signed)
Addended byDeidre Ala, Oneida Mckamey L on: 04/18/2016 02:44 PM   Modules accepted: Orders

## 2016-04-18 NOTE — Telephone Encounter (Signed)
[facility] states pt is waiting for rx to be called by doctor she saw today. I called Mobic ordered by Dr. Prudence Davidson to [facility].

## 2016-04-18 NOTE — Progress Notes (Signed)
   Subjective:    Patient ID: Christina Vargas, female    DOB: 10/27/1960, 56 y.o.   MRN: [id number]  HPI this patient presents the office with chief complaint of pain in her right forefoot. She says that she has been increasing her exercise and she believes that she aggravated her foot performing these exercises. She says that the pain has been increasing over the last 3 weeks, but she has been unable to be scheduled to be seen. She presents the office today stating that she is swollen and painful on the top of her right forefoot. She says there is localized area of swelling noted at the site. She has provided no self treatment nor sought any professional help. She presents the office today for an evaluation and treatment of this condition    Review of Systems  All other systems reviewed and are negative.      Objective:   Physical Exam GENERAL APPEARANCE: Alert, conversant. Appropriately groomed. No acute distress.  VASCULAR: Pedal pulses are  palpable at  [facility] and PT bilateral.  Capillary refill time is immediate to all digits,  Normal temperature gradient.  Digital hair growth is present bilateral  NEUROLOGIC: sensation is normal to 5.07 monofilament at 5/5 sites bilateral.  Light touch is intact bilateral, Muscle strength normal.  MUSCULOSKELETAL: acceptable muscle strength, tone and stability bilateral.  Intrinsic muscluature intact bilateral.  Rectus appearance of foot and digits noted bilateral. Palpable pain 2,3 MPJ right foot.  Swelling noted second interspace and over her MPJ.  No pain noted upon DF and PF right foot.  Severe HAV deformities both feet.  DERMATOLOGIC: skin color, texture, and turgor are within normal limits.  No preulcerative lesions or ulcers  are seen, no interdigital maceration noted.  No open lesions present.  Digital nails are asymptomatic. No drainage noted.         Assessment & Plan:  Capsulitis 2,3 right foot.  Neuroma second interspace right foot.  IE   Xrays taken reveal no pathology except severe HAV formation. Patient was treated with injection therapy at the office. She was also prescribed Mobic 15 mg #30, as well as a surgical shoe [place] leaving the [place] and no follow-up appointment will be made. She was told to "return to the office if the problem persists upon her return.   Gardiner Barefoot DPM

## 2016-07-10 DIAGNOSIS — R05 Cough: Secondary | ICD-10-CM | POA: Diagnosis not present

## 2016-08-22 DIAGNOSIS — N950 Postmenopausal bleeding: Secondary | ICD-10-CM | POA: Diagnosis not present

## 2016-08-22 DIAGNOSIS — N941 Unspecified dyspareunia: Secondary | ICD-10-CM | POA: Diagnosis not present

## 2016-08-24 DIAGNOSIS — M859 Disorder of bone density and structure, unspecified: Secondary | ICD-10-CM | POA: Diagnosis not present

## 2016-08-24 DIAGNOSIS — E782 Mixed hyperlipidemia: Secondary | ICD-10-CM | POA: Diagnosis not present

## 2016-08-24 DIAGNOSIS — Z0000 Encounter for general adult medical examination without abnormal findings: Secondary | ICD-10-CM | POA: Diagnosis not present

## 2016-08-30 DIAGNOSIS — Q761 Klippel-Feil syndrome: Secondary | ICD-10-CM | POA: Diagnosis not present

## 2016-08-30 DIAGNOSIS — M859 Disorder of bone density and structure, unspecified: Secondary | ICD-10-CM | POA: Diagnosis not present

## 2016-08-30 DIAGNOSIS — N950 Postmenopausal bleeding: Secondary | ICD-10-CM | POA: Diagnosis not present

## 2016-08-30 DIAGNOSIS — Z1389 Encounter for screening for other disorder: Secondary | ICD-10-CM | POA: Diagnosis not present

## 2016-08-30 DIAGNOSIS — Z8249 Family history of ischemic heart disease and other diseases of the circulatory system: Secondary | ICD-10-CM | POA: Diagnosis not present

## 2016-08-30 DIAGNOSIS — Z0000 Encounter for general adult medical examination without abnormal findings: Secondary | ICD-10-CM | POA: Diagnosis not present

## 2016-08-30 DIAGNOSIS — E782 Mixed hyperlipidemia: Secondary | ICD-10-CM | POA: Diagnosis not present

## 2016-08-31 DIAGNOSIS — Z1212 Encounter for screening for malignant neoplasm of rectum: Secondary | ICD-10-CM | POA: Diagnosis not present

## 2016-10-30 DIAGNOSIS — M5030 Other cervical disc degeneration, unspecified cervical region: Secondary | ICD-10-CM | POA: Diagnosis not present

## 2016-10-30 DIAGNOSIS — M7581 Other shoulder lesions, right shoulder: Secondary | ICD-10-CM | POA: Diagnosis not present

## 2016-11-07 ENCOUNTER — Ambulatory Visit (INDEPENDENT_AMBULATORY_CARE_PROVIDER_SITE_OTHER): Payer: BLUE CROSS/BLUE SHIELD | Admitting: Neurology

## 2016-11-07 ENCOUNTER — Encounter: Payer: Self-pay | Admitting: Neurology

## 2016-11-07 VITALS — BP 110/64 | HR 74 | Ht <= 58 in | Wt 122.1 lb

## 2016-11-07 DIAGNOSIS — M4802 Spinal stenosis, cervical region: Secondary | ICD-10-CM | POA: Diagnosis not present

## 2016-11-07 DIAGNOSIS — R202 Paresthesia of skin: Secondary | ICD-10-CM | POA: Diagnosis not present

## 2016-11-07 DIAGNOSIS — Q761 Klippel-Feil syndrome: Secondary | ICD-10-CM | POA: Diagnosis not present

## 2016-11-07 MED ORDER — DIAZEPAM 5 MG PO TABS: ORAL_TABLET | ORAL | 0 refills | Status: DC

## 2016-11-07 NOTE — Patient Instructions (Signed)
1. MRI cervical spine without contrast 2. NCS/EMG of the arms 3. Please call with the name and dose of the muscle relaxant that you have  We will call you with the results of your testing and decide the next step

## 2016-11-07 NOTE — Progress Notes (Signed)
[facility] Clinic Note - Initial Visit   Date: 11/07/16  Christina Vargas MRN: [id number] DOB: 05/10/1960   Dear Dr. Dagmar Hait:  Thank you for your kind referral of Christina Vargas for consultation of cervical canal stenosis. Although her history is well known to you, please allow [place] to reiterate it for the purpose of our medical record. The patient was accompanied to the clinic by self.   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  .   Starting around her 27s, she began having neck pain ans since then has had chronic issues with neck and arm pain.  Pain initially starts in the head/neck and has burning/stabbing pain over the shoulder and radiates into her arms.  There is also severe achy pain and stiffness of the neck.  Pain is somewhat improved by manually holding the chin up and with traction. She has known diffuse cervical spondylosis involving the C4-5, C5-6, and C6-7 levels which is followed by Dr. Sherwood Gambler, who has recommended surgery due to cervical instability.  She had tried neck physiotherapy and muscle relaxants, which have had variable benefit.  Over the past several years, she has noticed progressive pain and weakness of the arms.  She has greater difficulty with painting for prolonged periods because of neck pain and arms more fatigued.  Of note, she also has right shoulder discomfort and has subacromial injection for this which helped.   She has intermittent paresthesias of the upper extremity, especially numbness of the hands, which can wake her up from sleeping.  Out-side paper records, electronic medical record, and images have been reviewed where available and summarized as:  MRI cervical  spine wo contrast 09/25/2010:   1.  C4-C5 moderate central canal stenosis secondary to left paracentral disc extrusion.  Bilateral left greater than right foraminal stenosis. 2.  C5-C6 mild to moderate central canal stenosis secondary to broad-based disc osteophyte complex.  Left greater than right bilateral foraminal stenosis secondary to uncovertebral spurring. 3.  C6-C7 bilateral mild to moderate foraminal stenosis secondary to uncovertebral spurring. 4.  Ankylosis of C2-3 and T1-2.  2 mm anterolisthesis of T2-1, T3 is degenerative.  T2-T3 facet arthrosis account for anterolisthesis.  Past Medical History:  Diagnosis Date  . Hyperlipidemia   . Hypertension    Past surgical history Inner ear procedure due to left hearing deficits  Medications:  Outpatient Encounter Prescriptions as of 11/07/2016  Medication Sig  . pravastatin (PRAVACHOL) 40 MG tablet Take 40 mg by mouth daily.  . predniSONE (STERAPRED UNI-PAK 48 TAB) 10 MG (48) TBPK tablet Take by mouth daily.  . diazepam (VALIUM) 5 MG tablet Take 1 tablet 30-min prior to MRI.  Ok to repeat at facility if needed.   No facility-administered encounter medications on file as of 11/07/2016.     Allergies: No Known Allergies  Family History: Family History  Problem Relation Age of Onset  . Breast cancer Mother   . Heart disease Father   . Cancer Sister     Social History: Social History  Substance Use Topics  . Smoking status: Never Smoker  . Smokeless tobacco: Never Used  . Alcohol use Yes   Social History   Social History Narrative   Lives with husband in a 2 story home.  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  Education: college.    Review of Systems:  CONSTITUTIONAL: No fevers, chills, night sweats, or weight loss.   EYES: No visual changes or eye pain ENT: No hearing changes.  No history of nose bleeds.   RESPIRATORY: No cough, wheezing and shortness of breath.   CARDIOVASCULAR: Negative for chest pain, and  palpitations.   GI: Negative for abdominal discomfort, blood in stools or black stools.  No recent change in bowel habits.   GU:  No history of incontinence.   MUSCLOSKELETAL: +history of joint pain or swelling.  No myalgias.   SKIN: Negative for lesions, rash, and itching.   HEMATOLOGY/ONCOLOGY: Negative for prolonged bleeding, bruising easily, and swollen nodes.  No history of cancer.   ENDOCRINE: Negative for cold or heat intolerance, polydipsia or goiter.   PSYCH:  No depression or anxiety symptoms.   NEURO: As Above.   Vital Signs:  BP 110/64   Pulse 74   Ht 4\' 10"  (1.473 m)   Wt 122 lb 2 oz (55.4 kg)   SpO2 99%   BMI 25.52 kg/m   General Medical Exam:   General:  Well appearing, comfortable.   Eyes/ENT: see cranial nerve examination.   Neck: No masses appreciated.  Full range of motion without tenderness.  No carotid bruits. Respiratory:  Clear to auscultation, good air entry bilaterally.   Cardiac:  Regular rate and rhythm, no murmur.   Extremities:  No deformities, edema, or skin discoloration.  Skin:  No rashes or lesions.  Neurological Exam: MENTAL STATUS including orientation to time, place, person, recent and remote memory, attention span and concentration, language, and fund of knowledge is normal.  Speech is not dysarthric.  CRANIAL NERVES: II:  No visual field defects.  Unremarkable fundi.   III-IV-VI: Pupils equal round and reactive to light.  Normal conjugate, extra-ocular eye movements in all directions of gaze.  No nystagmus.  No ptosis.   V:  Normal facial sensation.   VII:  Normal facial symmetry and movements.  No pathologic facial reflexes.  VIII:  Normal hearing and vestibular function.   IX-X:  Normal palatal movement.   XI:  Normal shoulder shrug and head rotation.   XII:  Normal tongue strength and range of motion, no deviation or fasciculation.  MOTOR:  No atrophy, fasciculations or abnormal movements.  No pronator drift.  Tone is normal.     Right Upper Extremity:    Left Upper Extremity:    Deltoid  5/5   Deltoid  5/5   Biceps  5/5   Biceps  5/5   Triceps  5/5   Triceps  5/5   Wrist extensors  5/5   Wrist extensors  5/5   Wrist flexors  5/5   Wrist flexors  5/5   Finger extensors  5/5   Finger extensors  5/5   Finger flexors  5/5   Finger flexors  5/5   Dorsal interossei  5/5   Dorsal interossei  5/5   Abductor pollicis  5/5   Abductor pollicis  5/5   Tone (Ashworth scale)  0  Tone (Ashworth scale)  0   Right Lower Extremity:    Left Lower Extremity:    Hip flexors  5/5   Hip flexors  5/5   Hip extensors  5/5   Hip extensors  5/5   Knee flexors  5/5   Knee flexors  5/5   Knee extensors  5/5   Knee extensors  5/5  Dorsiflexors  5/5   Dorsiflexors  5/5   Plantarflexors  5/5   Plantarflexors  5/5   Toe extensors  5/5   Toe extensors  5/5   Toe flexors  5/5   Toe flexors  5/5   Tone (Ashworth scale)  0  Tone (Ashworth scale)  0   MSRs:  Right                                                                 Left brachioradialis 3+  brachioradialis 3+  biceps 2+  biceps 2+  triceps 2+  triceps 2+  patellar 2+  patellar 2+  ankle jerk 2+  ankle jerk 2+  Hoffman no  Hoffman no  plantar response down  plantar response down   SENSORY:  Normal and symmetric perception of light touch, pinprick, vibration, and proprioception.  Romberg's sign absent.   COORDINATION/GAIT: Normal finger-to- nose-finger and heel-to-shin.  Intact rapid alternating movements bilaterally.  Able to rise from a chair without using arms.  Gait narrow based and stable.    IMPRESSION: Christina Vargas is a delightful 56 year-old female with Klippel Feil syndrome and hyperlipidemia referred for evaluation of cervical canal stenosis.  I personally viewed her MRI cervical spine from 2012 which showed multilevel degenerative changes and spinal stenosis, especially at C4-5, C5-6, and C6-7.  There is no signal cord changes or cord deformity. I had a lengthy  discussion with patient regarding management options, which includes conservative therapy with muscle relaxants, neck PT, epidural steroid injection, or surgical decompression and fusion.  With her progressive neck pain and arm fatigue, I would like to get an update MRI of the cervical region, which would better guide management options.  She was informed that conservative therapies are only symptomatic and the goal of surgery is to alleviate pain and prevent neurological injury.    Regarding her hand paresthesias, this may be related to carpal tunnel syndrome.  NCS/EMG will be ordered to determine whether symptoms are due to nerve entrapment or cervical radiculopathy.  She most likely also has overlapping right shoulder arthritis which is contributing to her arm fatigue and pain and will follow-up with Dr. Alfonso Ramus for this.   PLAN/RECOMMENDATIONS:  1.  NCS/EMG upper extremities 2.  MRI cervical spine wo contrast 3.  Patient to call with name of muscle relaxant which she has at home so I can offer an alternative medication. 4.  Continue neck home exericses  Further recommendations will be based on the results of her testing   Thank you for allowing me to participate in patient's care.  If I can answer any additional questions, I would be pleased to do so.    Sincerely,    Rodnesha Elie K. Posey Pronto, DO

## 2016-11-22 ENCOUNTER — Telehealth: Payer: Self-pay | Admitting: Neurology

## 2016-11-22 ENCOUNTER — Ambulatory Visit (INDEPENDENT_AMBULATORY_CARE_PROVIDER_SITE_OTHER): Payer: BLUE CROSS/BLUE SHIELD | Admitting: Neurology

## 2016-11-22 DIAGNOSIS — M4802 Spinal stenosis, cervical region: Secondary | ICD-10-CM

## 2016-11-22 DIAGNOSIS — G5603 Carpal tunnel syndrome, bilateral upper limbs: Secondary | ICD-10-CM

## 2016-11-22 NOTE — Telephone Encounter (Signed)
Results of EMG discussed with patient which shows mild bilateral carpal tunnel syndrome. Recommend conservative therapy with using a wrist splint then night, and minimize hyperflexion at the wrist.  MRI cervical spine is pending to evaluate for any worsening spinal canal stenosis causing her neck pain, upper arm pain, and fatigue.   She also complains of right shoulder pain, which did not respond to methocarbamol/NSAIDS.  She had a steroid injection which helped alleviate her discomfort some. She is seeing Dr. Alfonso Ramus for this and I recommend follow-up and discussion with the further imaging of the shoulder is indicated.   [name] K. Posey Pronto, DO

## 2016-11-22 NOTE — Procedures (Signed)
[facility]  [address], [place]  [place], [place] [address] Tel: [phone] Fax:  [phone] Test Date:  11/22/2016  Patient: Christina Vargas DOB: 05/10/1960 Physician: Narda Amber, DO  Sex: Female Height: 4\' 10"  Ref Phys: Narda Amber, DO  ID#: [id number] Temp: 35.1C Technician:    Patient Complaints: This is a 56 year old female referred for evaluation of chronic neck pain and bilateral hand paresthesias.  NCV & EMG Findings: Extensive electrodiagnostic testing of the right upper extremity and additional studies of the left shows:  1. Bilateral median and ulnar sensory responses are within normal limits. Bilateral mixed palmar sensory responses are abnormal. 2. Bilateral median and ulnar motor responses are within normal limits. Of note, the left median motor response shows anomalous innervation to the abductor pollicis brevis, as noted by a motor response when stimulating at the ulnar wrist, consistent with a Martin-Gruber anastomosis. 3. No evidence of active or chronic motor axon loss changes affecting any of the tested muscles. Motor unit configuration and recruitment pattern is within normal limits.  Impression: 1. Bilateral median neuropathy at or distal to the wrist, consistent with the clinical diagnosis of carpal tunnel syndrome. Overall, these findings are mild in degree electrically. 2. 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   ___________________________ Narda Amber, DO    Nerve Conduction Studies Anti Sensory Summary Table   Site NR Peak (ms) Norm Peak (ms) P-T Amp (V) Norm P-T Amp  Left Median Anti Sensory (2nd Digit)  35.1C  Wrist    3.1 <3.6 37.5 >15  Right Median Anti Sensory (2nd Digit)  Wrist    2.8 <3.6 38.0 >15  Left Ulnar Anti Sensory (5th Digit)  35.1C  Wrist    2.5 <3.1 34.6 >10  Right Ulnar Anti Sensory (5th Digit)  Wrist    2.2 <3.1 34.9 >10   Motor Summary Table   Site NR Onset  (ms) Norm Onset (ms) O-P Amp (mV) Norm O-P Amp Site1 Site2 Delta-0 (ms) Dist (cm) Vel (m/s) Norm Vel (m/s)  Left Median Motor (Abd Poll Brev)  35.1C  Wrist    3.5 <4.0 8.7 >6 Elbow Wrist 4.3 24.0 56 >50  Elbow    7.8  9.8  Ulnar-wrist crossover Elbow 4.1 0.0    Ulnar-wrist crossover    3.7  3.4         Right Median Motor (Abd Poll Brev)  Wrist    2.6 <4.0 8.9 >6 Elbow Wrist 4.4 24.0 55 >50  Elbow    7.0  8.3         Left Ulnar Motor (Abd Dig Minimi)  35.1C  Wrist    2.1 <3.1 7.9 >7 B Elbow Wrist 3.0 21.0 70 >50  B Elbow    5.1  6.5  A Elbow B Elbow 1.7 10.0 59 >50  A Elbow    6.8  6.2         Right Ulnar Motor (Abd Dig Minimi)  35.1C  Wrist    1.7 <3.1 7.8 >7 B Elbow Wrist 3.1 20.5 66 >50  B Elbow    4.8  7.6  A Elbow B Elbow 1.7 10.0 59 >50  A Elbow    6.5  6.8          Comparison Summary Table   Site NR Peak (ms) Norm Peak (ms) P-T Amp (V) Site1 Site2 Delta-P (ms) Norm Delta (ms)  Left Median/Ulnar Palm Comparison (Wrist - 8cm)  35.1C  Median [employer]  2.0 <2.2 47.9 Median Palm Ulnar Palm 0.7   Ulnar Palm    1.3 <2.2 31.5      Right Median/Ulnar Palm Comparison (Wrist - 8cm)  35.1C  Median Palm    1.7 <2.2 72.5 Median Palm Ulnar Palm 0.4   Ulnar Palm    1.3 <2.2 21.4       EMG   Side Muscle Ins Act Fibs Psw Fasc Number Recrt Dur Dur. Amp Amp. Poly Poly. Comment  Right 1stDorInt Nml Nml Nml Nml Nml Nml Nml Nml Nml Nml Nml Nml N/A  Right Abd Poll Brev Nml Nml Nml Nml Nml Nml Nml Nml Nml Nml Nml Nml N/A  Right PronatorTeres Nml Nml Nml Nml Nml Nml Nml Nml Nml Nml Nml Nml N/A  Right Biceps Nml Nml Nml Nml Nml Nml Nml Nml Nml Nml Nml Nml N/A  Right Triceps Nml Nml Nml Nml Nml Nml Nml Nml Nml Nml Nml Nml N/A  Right Deltoid Nml Nml Nml Nml Nml Nml Nml Nml Nml Nml Nml Nml N/A  Left 1stDorInt Nml Nml Nml Nml Nml Nml Nml Nml Nml Nml Nml Nml N/A  Left Abd Poll Brev Nml Nml Nml Nml Nml Nml Nml Nml Nml Nml Nml Nml N/A  Left PronatorTeres Nml Nml Nml Nml Nml Nml Nml Nml Nml Nml Nml Nml  N/A  Left Biceps Nml Nml Nml Nml Nml Nml Nml Nml Nml Nml Nml Nml N/A  Left Triceps Nml Nml Nml Nml Nml Nml Nml Nml Nml Nml Nml Nml N/A  Left Deltoid Nml Nml Nml Nml Nml Nml Nml Nml Nml Nml Nml Nml N/A  Left 1stDorInt Nml Nml Nml Nml Nml Nml Nml Nml Nml Nml Nml Nml N/A      Waveforms:

## 2016-11-29 ENCOUNTER — Other Ambulatory Visit: Payer: Self-pay | Admitting: *Deleted

## 2016-11-29 DIAGNOSIS — R202 Paresthesia of skin: Secondary | ICD-10-CM

## 2016-11-29 DIAGNOSIS — M4802 Spinal stenosis, cervical region: Secondary | ICD-10-CM

## 2016-11-30 ENCOUNTER — Telehealth: Payer: Self-pay | Admitting: *Deleted

## 2016-11-30 NOTE — Telephone Encounter (Signed)
MRI PA #: [id number],  Valid: 11-16 to 12-15.

## 2016-12-04 DIAGNOSIS — M4802 Spinal stenosis, cervical region: Secondary | ICD-10-CM | POA: Diagnosis not present

## 2016-12-11 ENCOUNTER — Telehealth: Payer: Self-pay | Admitting: Neurology

## 2016-12-11 NOTE — Telephone Encounter (Signed)
All records faxed

## 2016-12-11 NOTE — Telephone Encounter (Signed)
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 The bony detail is difficult on MRI and could be better evaluated on CT to confirm levels of degenerative disease. The vertebral body levels are labeled on series 3 and series 4 of this study.   1. Advanced multilevel degenerative changes, detailed above, result in moderate canal stenosis at C4-C5 and C5-C6. There is moderate to advanced neural foraminal stenosis at C3-4 through C6-7.  2. Edema within the C5 and C6 vertebral bodies is favored degenerative in etiology. A small amount of prevertebral edema at C4-C6 may also be related to the advanced degenerative disc changes and osteophytes at these levels. 3. Incomplete segmentation of C2-C3 and T1-T2. 4. 2-3 mm anterolisthesis of T2-T3 with facet hypertrophy and small facet joint effusions at that level. 5. Focal lesion in the T5 vertebral body is indeterminate but statistically likely to be a benign hemangioma or venous malformation in the absence of known malignancy.   The above results were discussed with patient.  I have requested CD images to be forward to me to review personally.  By comparing this report to the MRI cervical spine from 2012, there has been progressive multilevel degenerative changes and foraminal stenosis.  Canal stenosis is moderate at C4-5 and slightly worse at C5-6.  Recommend follow-up with her neurosurgeon, Dr. Sherwood Gambler, to discuss these findings as well as surgical consideration.  I have asked her to take the CD of her recent MRI along with her visit.   Donika K. Posey Pronto, DO

## 2017-01-21 DIAGNOSIS — M5030 Other cervical disc degeneration, unspecified cervical region: Secondary | ICD-10-CM | POA: Diagnosis not present

## 2017-01-21 DIAGNOSIS — M4722 Other spondylosis with radiculopathy, cervical region: Secondary | ICD-10-CM | POA: Diagnosis not present

## 2017-01-21 DIAGNOSIS — M5416 Radiculopathy, lumbar region: Secondary | ICD-10-CM | POA: Diagnosis not present

## 2017-01-21 DIAGNOSIS — M5412 Radiculopathy, cervical region: Secondary | ICD-10-CM | POA: Diagnosis not present

## 2017-01-21 DIAGNOSIS — Q761 Klippel-Feil syndrome: Secondary | ICD-10-CM | POA: Diagnosis not present

## 2017-02-28 ENCOUNTER — Ambulatory Visit (INDEPENDENT_AMBULATORY_CARE_PROVIDER_SITE_OTHER): Payer: BLUE CROSS/BLUE SHIELD

## 2017-02-28 ENCOUNTER — Encounter: Payer: Self-pay | Admitting: Podiatry

## 2017-02-28 ENCOUNTER — Other Ambulatory Visit: Payer: Self-pay | Admitting: Podiatry

## 2017-02-28 ENCOUNTER — Ambulatory Visit: Payer: BLUE CROSS/BLUE SHIELD | Admitting: Podiatry

## 2017-02-28 VITALS — BP 111/76 | HR 61 | Resp 16

## 2017-02-28 DIAGNOSIS — M778 Other enthesopathies, not elsewhere classified: Secondary | ICD-10-CM

## 2017-02-28 DIAGNOSIS — M779 Enthesopathy, unspecified: Principal | ICD-10-CM

## 2017-02-28 DIAGNOSIS — M7752 Other enthesopathy of left foot: Secondary | ICD-10-CM

## 2017-02-28 MED ORDER — MELOXICAM 15 MG PO TABS: 15.0000 mg | ORAL_TABLET | Freq: Every day | ORAL | 3 refills | Status: DC

## 2017-02-28 MED ORDER — METHYLPREDNISOLONE 4 MG PO TBPK: ORAL_TABLET | ORAL | 0 refills | Status: DC

## 2017-02-28 NOTE — Progress Notes (Signed)
  Subjective:  Patient ID: Christina Vargas, female    DOB: 1960-09-21,  MRN: [id number] HPI Chief Complaint  Patient presents with  . Foot Pain    Dorsal forefoot and midfoot left - aching x 3 weeks, some swelling, no injury, wedge type shoes feel the best, tried Advil PRN-some help  Patient is going to [place] on March 3rd.  57 y.o. female presents with the above complaint.     Past Medical History:  Diagnosis Date  . Hyperlipidemia   . Hypertension      Current Outpatient Medications:  .  meloxicam (MOBIC) 7.5 MG tablet, , Disp: , Rfl: 2 .  pravastatin (PRAVACHOL) 40 MG tablet, Take 40 mg by mouth daily., Disp: , Rfl:   No Known Allergies Review of Systems  All other systems reviewed and are negative.  Objective:   Vitals:   02/28/17 0829  BP: 111/76  Pulse: 61  Resp: 16    General: Well developed, nourished, in no acute distress, alert and oriented x3   Dermatological: Skin is warm, dry and supple bilateral. Nails x 10 are well maintained; remaining integument appears unremarkable at this time. There are no open sores, no preulcerative lesions, no rash or signs of infection present.  Vascular: Dorsalis Pedis artery and Posterior Tibial artery pedal pulses are 2/4 bilateral with immedate capillary fill time. Pedal hair growth present. No varicosities and no lower extremity edema present bilateral.   Neruologic: Grossly intact via light touch bilateral. Vibratory intact via tuning fork bilateral. Protective threshold with Semmes Wienstein monofilament intact to all pedal sites bilateral. Patellar and Achilles deep tendon reflexes 2+ bilateral. No Babinski or clonus noted bilateral.   Musculoskeletal: No gross boney pedal deformities bilateral. No pain, crepitus, or limitation noted with foot and ankle range of motion bilateral. Muscular strength 5/5 in all groups tested bilateral.  Pain to the dorsal aspect of the left foot tarsometatarsal joints and just distal to that  area.  Gait: Unassisted, Nonantalgic.    Radiographs:  Taken today demonstrate only soft tissue swelling dorsal aspect of the foot.  No fractures identified.  Assessment & Plan:   Assessment: Capsulitis dorsal aspect left foot.  Plan: After sterile Betadine skin prep injected the area today with Kenalog and local anesthetic consisting 20 mg of Kenalog 5 mg Marcaine point of maximal tenderness.  Tolerated procedure well without complications.     Ajani Rineer T. [place], [place]

## 2017-03-05 ENCOUNTER — Telehealth: Payer: Self-pay | Admitting: Podiatry

## 2017-03-05 NOTE — Telephone Encounter (Signed)
I'm a pt of Dr. Stephenie Acres. I'm on my fourth day on the medrol Dosepak. As of the second and third day my face is flushed and it kind of looks like I have a sunburn. Yesterday my heart was really beating a lot. I stopped taking the medicine because I thinks its a reaction. I wanted to understand what I should do now. Please call me on my cell at [phone]. Thank you.

## 2017-03-05 NOTE — Telephone Encounter (Signed)
I spoke with pt and she states she also has a headache. I told pt that on occasion pts have an intolerance to steroid, with the symptom she is having, and told her to take benadryl for the flushing and cool cloths to the areas, tylenol for the headache if she tolerated that, and begin the meloxicam for the foot pain. Pt states understanding.

## 2017-03-13 DIAGNOSIS — Z01419 Encounter for gynecological examination (general) (routine) without abnormal findings: Secondary | ICD-10-CM | POA: Diagnosis not present

## 2017-03-13 DIAGNOSIS — Z6824 Body mass index (BMI) 24.0-24.9, adult: Secondary | ICD-10-CM | POA: Diagnosis not present

## 2017-03-13 DIAGNOSIS — Z1231 Encounter for screening mammogram for malignant neoplasm of breast: Secondary | ICD-10-CM | POA: Diagnosis not present

## 2017-03-15 DIAGNOSIS — R002 Palpitations: Secondary | ICD-10-CM | POA: Diagnosis not present

## 2017-03-15 DIAGNOSIS — Z6824 Body mass index (BMI) 24.0-24.9, adult: Secondary | ICD-10-CM | POA: Diagnosis not present

## 2017-03-15 DIAGNOSIS — E7849 Other hyperlipidemia: Secondary | ICD-10-CM | POA: Diagnosis not present

## 2017-03-15 DIAGNOSIS — Z8249 Family history of ischemic heart disease and other diseases of the circulatory system: Secondary | ICD-10-CM | POA: Diagnosis not present

## 2017-04-11 DIAGNOSIS — R05 Cough: Secondary | ICD-10-CM | POA: Diagnosis not present

## 2017-04-11 DIAGNOSIS — J019 Acute sinusitis, unspecified: Secondary | ICD-10-CM | POA: Diagnosis not present

## 2017-04-11 DIAGNOSIS — R197 Diarrhea, unspecified: Secondary | ICD-10-CM | POA: Diagnosis not present

## 2017-04-11 DIAGNOSIS — Z6824 Body mass index (BMI) 24.0-24.9, adult: Secondary | ICD-10-CM | POA: Diagnosis not present

## 2017-04-29 DIAGNOSIS — J0100 Acute maxillary sinusitis, unspecified: Secondary | ICD-10-CM | POA: Diagnosis not present

## 2017-04-29 DIAGNOSIS — J029 Acute pharyngitis, unspecified: Secondary | ICD-10-CM | POA: Diagnosis not present

## 2017-04-29 DIAGNOSIS — R05 Cough: Secondary | ICD-10-CM | POA: Diagnosis not present

## 2017-04-29 DIAGNOSIS — R52 Pain, unspecified: Secondary | ICD-10-CM | POA: Diagnosis not present

## 2017-05-08 DIAGNOSIS — Z13820 Encounter for screening for osteoporosis: Secondary | ICD-10-CM | POA: Diagnosis not present

## 2017-06-20 DIAGNOSIS — E785 Hyperlipidemia, unspecified: Secondary | ICD-10-CM | POA: Diagnosis not present

## 2017-06-20 DIAGNOSIS — Z8249 Family history of ischemic heart disease and other diseases of the circulatory system: Secondary | ICD-10-CM | POA: Diagnosis not present

## 2017-06-20 DIAGNOSIS — R0789 Other chest pain: Secondary | ICD-10-CM | POA: Diagnosis not present

## 2017-06-24 DIAGNOSIS — R0789 Other chest pain: Secondary | ICD-10-CM | POA: Diagnosis not present

## 2017-06-28 DIAGNOSIS — E785 Hyperlipidemia, unspecified: Secondary | ICD-10-CM | POA: Diagnosis not present

## 2017-06-28 DIAGNOSIS — Z8249 Family history of ischemic heart disease and other diseases of the circulatory system: Secondary | ICD-10-CM | POA: Diagnosis not present

## 2017-06-28 DIAGNOSIS — R0789 Other chest pain: Secondary | ICD-10-CM | POA: Diagnosis not present

## 2017-09-25 DIAGNOSIS — Z0000 Encounter for general adult medical examination without abnormal findings: Secondary | ICD-10-CM | POA: Diagnosis not present

## 2017-09-25 DIAGNOSIS — M859 Disorder of bone density and structure, unspecified: Secondary | ICD-10-CM | POA: Diagnosis not present

## 2017-09-25 DIAGNOSIS — E782 Mixed hyperlipidemia: Secondary | ICD-10-CM | POA: Diagnosis not present

## 2017-10-01 DIAGNOSIS — M8580 Other specified disorders of bone density and structure, unspecified site: Secondary | ICD-10-CM | POA: Diagnosis not present

## 2017-10-01 DIAGNOSIS — Z0000 Encounter for general adult medical examination without abnormal findings: Secondary | ICD-10-CM | POA: Diagnosis not present

## 2017-10-01 DIAGNOSIS — Z1389 Encounter for screening for other disorder: Secondary | ICD-10-CM | POA: Diagnosis not present

## 2017-10-01 DIAGNOSIS — E782 Mixed hyperlipidemia: Secondary | ICD-10-CM | POA: Diagnosis not present

## 2017-10-02 DIAGNOSIS — Z1212 Encounter for screening for malignant neoplasm of rectum: Secondary | ICD-10-CM | POA: Diagnosis not present

## 2018-01-01 DIAGNOSIS — Z23 Encounter for immunization: Secondary | ICD-10-CM | POA: Diagnosis not present

## 2018-10-14 ENCOUNTER — Other Ambulatory Visit: Payer: Self-pay

## 2018-10-14 DIAGNOSIS — R6889 Other general symptoms and signs: Secondary | ICD-10-CM | POA: Diagnosis not present

## 2018-10-14 DIAGNOSIS — Z20822 Contact with and (suspected) exposure to covid-19: Secondary | ICD-10-CM

## 2018-10-15 LAB — NOVEL CORONAVIRUS, NAA: SARS-CoV-2, NAA: NOT DETECTED

## 2018-10-17 DIAGNOSIS — E559 Vitamin D deficiency, unspecified: Secondary | ICD-10-CM | POA: Diagnosis not present

## 2018-10-17 DIAGNOSIS — E782 Mixed hyperlipidemia: Secondary | ICD-10-CM | POA: Diagnosis not present

## 2018-10-17 DIAGNOSIS — Z0000 Encounter for general adult medical examination without abnormal findings: Secondary | ICD-10-CM | POA: Diagnosis not present

## 2018-10-24 DIAGNOSIS — Q761 Klippel-Feil syndrome: Secondary | ICD-10-CM | POA: Diagnosis not present

## 2018-10-24 DIAGNOSIS — Z0000 Encounter for general adult medical examination without abnormal findings: Secondary | ICD-10-CM | POA: Diagnosis not present

## 2018-10-24 DIAGNOSIS — Z1331 Encounter for screening for depression: Secondary | ICD-10-CM | POA: Diagnosis not present

## 2018-10-24 DIAGNOSIS — Z8249 Family history of ischemic heart disease and other diseases of the circulatory system: Secondary | ICD-10-CM | POA: Diagnosis not present

## 2018-10-24 DIAGNOSIS — M8580 Other specified disorders of bone density and structure, unspecified site: Secondary | ICD-10-CM | POA: Diagnosis not present

## 2018-10-24 DIAGNOSIS — E782 Mixed hyperlipidemia: Secondary | ICD-10-CM | POA: Diagnosis not present

## 2018-10-27 DIAGNOSIS — Z1212 Encounter for screening for malignant neoplasm of rectum: Secondary | ICD-10-CM | POA: Diagnosis not present

## 2018-10-30 DIAGNOSIS — Z1231 Encounter for screening mammogram for malignant neoplasm of breast: Secondary | ICD-10-CM | POA: Diagnosis not present

## 2018-10-30 DIAGNOSIS — Z01419 Encounter for gynecological examination (general) (routine) without abnormal findings: Secondary | ICD-10-CM | POA: Diagnosis not present

## 2018-10-30 DIAGNOSIS — Z6824 Body mass index (BMI) 24.0-24.9, adult: Secondary | ICD-10-CM | POA: Diagnosis not present

## 2018-11-18 DIAGNOSIS — N941 Unspecified dyspareunia: Secondary | ICD-10-CM | POA: Diagnosis not present

## 2018-11-18 DIAGNOSIS — M6281 Muscle weakness (generalized): Secondary | ICD-10-CM | POA: Diagnosis not present

## 2018-11-18 DIAGNOSIS — R350 Frequency of micturition: Secondary | ICD-10-CM | POA: Diagnosis not present

## 2018-11-18 DIAGNOSIS — R351 Nocturia: Secondary | ICD-10-CM | POA: Diagnosis not present

## 2018-12-02 DIAGNOSIS — R197 Diarrhea, unspecified: Secondary | ICD-10-CM | POA: Diagnosis not present

## 2018-12-02 DIAGNOSIS — R351 Nocturia: Secondary | ICD-10-CM | POA: Diagnosis not present

## 2018-12-02 DIAGNOSIS — R350 Frequency of micturition: Secondary | ICD-10-CM | POA: Diagnosis not present

## 2018-12-02 DIAGNOSIS — N941 Unspecified dyspareunia: Secondary | ICD-10-CM | POA: Diagnosis not present

## 2018-12-03 ENCOUNTER — Other Ambulatory Visit: Payer: Self-pay

## 2018-12-03 DIAGNOSIS — Z20822 Contact with and (suspected) exposure to covid-19: Secondary | ICD-10-CM

## 2018-12-05 LAB — NOVEL CORONAVIRUS, NAA: SARS-CoV-2, NAA: NOT DETECTED

## 2018-12-23 DIAGNOSIS — Z03818 Encounter for observation for suspected exposure to other biological agents ruled out: Secondary | ICD-10-CM | POA: Diagnosis not present

## 2019-01-13 DIAGNOSIS — Z20828 Contact with and (suspected) exposure to other viral communicable diseases: Secondary | ICD-10-CM | POA: Diagnosis not present

## 2019-04-03 DIAGNOSIS — F419 Anxiety disorder, unspecified: Secondary | ICD-10-CM | POA: Diagnosis not present

## 2019-04-13 DIAGNOSIS — F419 Anxiety disorder, unspecified: Secondary | ICD-10-CM | POA: Diagnosis not present

## 2019-04-20 DIAGNOSIS — F419 Anxiety disorder, unspecified: Secondary | ICD-10-CM | POA: Diagnosis not present

## 2019-04-29 DIAGNOSIS — F419 Anxiety disorder, unspecified: Secondary | ICD-10-CM | POA: Diagnosis not present

## 2019-05-04 DIAGNOSIS — F419 Anxiety disorder, unspecified: Secondary | ICD-10-CM | POA: Diagnosis not present

## 2019-05-18 DIAGNOSIS — F419 Anxiety disorder, unspecified: Secondary | ICD-10-CM | POA: Diagnosis not present

## 2019-05-21 DIAGNOSIS — Z13820 Encounter for screening for osteoporosis: Secondary | ICD-10-CM | POA: Diagnosis not present

## 2019-05-25 DIAGNOSIS — F419 Anxiety disorder, unspecified: Secondary | ICD-10-CM | POA: Diagnosis not present

## 2019-06-09 DIAGNOSIS — H524 Presbyopia: Secondary | ICD-10-CM | POA: Diagnosis not present

## 2019-06-10 DIAGNOSIS — F419 Anxiety disorder, unspecified: Secondary | ICD-10-CM | POA: Diagnosis not present

## 2019-06-24 DIAGNOSIS — F419 Anxiety disorder, unspecified: Secondary | ICD-10-CM | POA: Diagnosis not present

## 2019-07-09 DIAGNOSIS — F419 Anxiety disorder, unspecified: Secondary | ICD-10-CM | POA: Diagnosis not present

## 2019-07-10 DIAGNOSIS — L259 Unspecified contact dermatitis, unspecified cause: Secondary | ICD-10-CM | POA: Diagnosis not present

## 2019-07-10 DIAGNOSIS — R5383 Other fatigue: Secondary | ICD-10-CM | POA: Diagnosis not present

## 2019-07-10 DIAGNOSIS — R0781 Pleurodynia: Secondary | ICD-10-CM | POA: Diagnosis not present

## 2019-07-10 DIAGNOSIS — L298 Other pruritus: Secondary | ICD-10-CM | POA: Diagnosis not present

## 2019-07-11 ENCOUNTER — Emergency Department (HOSPITAL_COMMUNITY)
Admission: EM | Admit: 2019-07-11 | Discharge: 2019-07-11 | Disposition: A | Discharge status: Home / Self Care | Payer: BC Managed Care – PPO | Attending: Emergency Medicine | Admitting: Emergency Medicine

## 2019-07-11 ENCOUNTER — Encounter (HOSPITAL_COMMUNITY): Payer: Self-pay

## 2019-07-11 ENCOUNTER — Emergency Department (HOSPITAL_COMMUNITY): Payer: BC Managed Care – PPO

## 2019-07-11 ENCOUNTER — Other Ambulatory Visit: Payer: Self-pay

## 2019-07-11 DIAGNOSIS — M545 Low back pain: Secondary | ICD-10-CM | POA: Insufficient documentation

## 2019-07-11 DIAGNOSIS — M546 Pain in thoracic spine: Secondary | ICD-10-CM | POA: Insufficient documentation

## 2019-07-11 DIAGNOSIS — R109 Unspecified abdominal pain: Secondary | ICD-10-CM | POA: Diagnosis not present

## 2019-07-11 DIAGNOSIS — R21 Rash and other nonspecific skin eruption: Secondary | ICD-10-CM | POA: Insufficient documentation

## 2019-07-11 DIAGNOSIS — D259 Leiomyoma of uterus, unspecified: Secondary | ICD-10-CM | POA: Diagnosis not present

## 2019-07-11 DIAGNOSIS — R110 Nausea: Secondary | ICD-10-CM | POA: Insufficient documentation

## 2019-07-11 DIAGNOSIS — R10812 Left upper quadrant abdominal tenderness: Secondary | ICD-10-CM | POA: Diagnosis not present

## 2019-07-11 DIAGNOSIS — R1013 Epigastric pain: Secondary | ICD-10-CM | POA: Insufficient documentation

## 2019-07-11 DIAGNOSIS — R748 Abnormal levels of other serum enzymes: Secondary | ICD-10-CM | POA: Diagnosis not present

## 2019-07-11 DIAGNOSIS — Z7982 Long term (current) use of aspirin: Secondary | ICD-10-CM | POA: Diagnosis not present

## 2019-07-11 DIAGNOSIS — I10 Essential (primary) hypertension: Secondary | ICD-10-CM | POA: Insufficient documentation

## 2019-07-11 DIAGNOSIS — I878 Other specified disorders of veins: Secondary | ICD-10-CM | POA: Diagnosis not present

## 2019-07-11 DIAGNOSIS — M549 Dorsalgia, unspecified: Secondary | ICD-10-CM | POA: Diagnosis not present

## 2019-07-11 LAB — COMPREHENSIVE METABOLIC PANEL
ALT: 23 U/L (ref 0–44)
AST: 18 U/L (ref 15–41)
Albumin: 4.5 g/dL (ref 3.5–5.0)
Alkaline Phosphatase: 46 U/L (ref 38–126)
Anion gap: 10 (ref 5–15)
BUN: 19 mg/dL (ref 6–20)
CO2: 24 mmol/L (ref 22–32)
Calcium: 9 mg/dL (ref 8.9–10.3)
Chloride: 99 mmol/L (ref 98–111)
Creatinine, Ser: 0.81 mg/dL (ref 0.44–1.00)
GFR calc Af Amer: >60 mL/min (ref >60)
GFR calc non Af Amer: >60 mL/min (ref >60)
Glucose, Bld: 118 mg/dL — ABNORMAL HIGH (ref 70–99)
Potassium: 3.7 mmol/L (ref 3.5–5.1)
Sodium: 133 mmol/L — ABNORMAL LOW (ref 135–145)
Total Bilirubin: 0.6 mg/dL (ref 0.3–1.2)
Total Protein: 7.2 g/dL (ref 6.5–8.1)

## 2019-07-11 LAB — URINALYSIS, ROUTINE W REFLEX MICROSCOPIC
Bilirubin Urine: NEGATIVE
Glucose, UA: NEGATIVE mg/dL
Hgb urine dipstick: NEGATIVE
Ketones, ur: NEGATIVE mg/dL
Leukocytes,Ua: NEGATIVE
Nitrite: NEGATIVE
Protein, ur: NEGATIVE mg/dL
Specific Gravity, Urine: 1.008 (ref 1.005–1.030)
pH: 6 (ref 5.0–8.0)

## 2019-07-11 LAB — LIPASE, BLOOD: Lipase: 83 U/L — ABNORMAL HIGH (ref 11–51)

## 2019-07-11 LAB — CBC
HCT: 41.1 % (ref 36.0–46.0)
Hemoglobin: 13.3 g/dL (ref 12.0–15.0)
MCH: 28.7 pg (ref 26.0–34.0)
MCHC: 32.4 g/dL (ref 30.0–36.0)
MCV: 88.8 fL (ref 80.0–100.0)
Platelets: 319 10*3/uL (ref 150–400)
RBC: 4.63 MIL/uL (ref 3.87–5.11)
RDW: 13.2 % (ref 11.5–15.5)
WBC: 8 10*3/uL (ref 4.0–10.5)
nRBC: 0 % (ref 0.0–0.2)

## 2019-07-11 MED ORDER — ONDANSETRON 4 MG PO TBDP
4.0000 mg | ORAL_TABLET | Freq: Three times a day (TID) | ORAL | 0 refills | Status: DC | PRN
Start: 2019-07-11 — End: 2019-11-02

## 2019-07-11 MED ORDER — ONDANSETRON HCL 4 MG/2ML IJ SOLN
4.0000 mg | Freq: Once | INTRAMUSCULAR | Status: AC
  Administered 2019-07-11: 4 mg via INTRAVENOUS
  Filled 2019-07-11: qty 2

## 2019-07-11 MED ORDER — IOHEXOL 300 MG/ML  SOLN
80.0000 mL | Freq: Once | INTRAMUSCULAR | Status: AC | PRN
  Administered 2019-07-11: 80 mL via INTRAVENOUS

## 2019-07-11 MED ORDER — HYDROMORPHONE HCL 1 MG/ML IJ SOLN
0.5000 mg | Freq: Once | INTRAMUSCULAR | Status: AC
  Administered 2019-07-11: 0.5 mg via INTRAVENOUS
  Filled 2019-07-11: qty 1

## 2019-07-11 MED ORDER — SODIUM CHLORIDE 0.9 % IV BOLUS
1000.0000 mL | Freq: Once | INTRAVENOUS | Status: AC
  Administered 2019-07-11: 1000 mL via INTRAVENOUS

## 2019-07-11 MED ORDER — OXYCODONE-ACETAMINOPHEN 5-325 MG PO TABS: 1.0000 | ORAL_TABLET | Freq: Four times a day (QID) | ORAL | 0 refills | Status: DC | PRN

## 2019-07-11 MED ORDER — OMEPRAZOLE 20 MG PO CPDR
20.0000 mg | DELAYED_RELEASE_CAPSULE | Freq: Every day | ORAL | 0 refills | Status: DC
Start: 2019-07-11 — End: 2019-11-02

## 2019-07-11 MED ORDER — FAMOTIDINE 20 MG PO TABS
20.0000 mg | ORAL_TABLET | Freq: Two times a day (BID) | ORAL | 0 refills | Status: DC
Start: 2019-07-11 — End: 2019-11-02

## 2019-07-11 MED ORDER — SODIUM CHLORIDE (PF) 0.9 % IJ SOLN
INTRAMUSCULAR | Status: AC
  Filled 2019-07-11: qty 50

## 2019-07-11 MED ORDER — SUCRALFATE 1 G PO TABS
1.0000 g | ORAL_TABLET | Freq: Three times a day (TID) | ORAL | 0 refills | Status: DC
Start: 2019-07-11 — End: 2019-11-02

## 2019-07-11 NOTE — ED Triage Notes (Signed)
Pt sts upper back pain radiating to RUQ abd.

## 2019-07-11 NOTE — ED Provider Notes (Signed)
[facility] DEPT Provider Note   CSN: [id number] Arrival date & time: 07/11/19  8101     History Chief Complaint  Patient presents with  . Abdominal Pain    Christina Vargas is a 59 y.o. female.  Patient with no past surgical history presents the emergency department with 2 days of upper abdominal pain with radiation to her mid back on the right side.  She has had severe nausea with this but no vomiting.  No chest pain or shortness of breath. No hematuria or irritative UTI symptoms including dysuria, increased frequency or urgency.  No diarrhea or constipation.  Patient states that she has a family history of pancreatitis, due to unknown cause.  She has never had an episode of pancreatitis.  She still has her gallbladder and appendix.  Patient states that she drinks scotch every other day.  She takes meloxicam regularly.  She has not been able to sleep the past 2 nights because of the pain.  She was evaluated at an urgent care and was given a muscle relaxer which she took once and then vomited.  She has not taken any more this.  She has been applying Lidoderm patches to the area on her mid right back.  She was also prescribed valacyclovir in case she developed a rash in the area of pain which could indicate shingles.  Patient has an itchy area of redness on her left lower back which she thinks is related to poison oak.         Past Medical History:  Diagnosis Date  . Deafness    since birth left ear  . 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 Hypercholesteremia   . Hyperlipidemia   . Hypertension   . Osteopenia     Patient Active Problem List   Diagnosis Date Noted  . Cervical stenosis of spinal canal 11/07/2016  . Family history of breast cancer 01/21/2014  . Family history of malignant neoplasm of ovary 01/21/2014    Past Surgical History:  Procedure Laterality Date  . COCHLEAR IMPLANT     left ear  .  PLACEMENT OF BREAST IMPLANTS  2009     OB History   No obstetric history on file.     Family History  Problem Relation Age of Onset  . Breast cancer Mother   . Heart disease Father   . Cancer Sister   . Cancer Mother 39       breast  . Cancer Sister 55       dysplastic nevi on leg  . Cancer Brother 60       dysplastic nevi or basal cell on back  . Cancer Maternal Aunt 20       breast  . Cancer Sister 11       chondrosarcoma  . Cancer Other        niece with possible thyroid ca and nephew with brain tumor ? cancerous (these are siblings related to patient through patient's sister with chondrosarcoma)  . Cancer Other        two first cousins once removed with leukemia in their 63s (related to patient through mat aunt with breast cancer)    Social History   Tobacco Use  . Smoking status: Never Smoker  . Smokeless tobacco: Never Used  Vaping Use  . Vaping Use: Never used  Substance Use Topics  . Alcohol use: Yes  . Drug use: No    Home  Medications Prior to Admission medications   Medication Sig Start Date End Date Taking? Authorizing Provider  aspirin 81 MG tablet Take 81 mg by mouth daily.    [provider]  calcium citrate-vitamin D (CITRACAL+D) 315-200 MG-UNIT per tablet Take 1 tablet by mouth daily.    [provider]  cephALEXin (KEFLEX) 500 MG capsule Take 1 capsule (500 mg total) by mouth 3 (three) times daily. 11/14/12   Harriet Masson, DPM  meloxicam (MOBIC) 15 MG tablet Take 1 tablet (15 mg total) by mouth daily. 04/18/16   Gardiner Barefoot, DPM  meloxicam (MOBIC) 15 MG tablet Take 1 tablet (15 mg total) by mouth daily. 02/28/17   Hyatt, Max T, DPM  meloxicam (MOBIC) 7.5 MG tablet  12/03/16   [provider]  methylPREDNISolone (MEDROL DOSEPAK) 4 MG TBPK tablet 6 day dose pack - take as directed 02/28/17   Hyatt, Max T, DPM  PARoxetine Mesylate 7.5 MG CAPS Take 1 capsule by mouth at bedtime.    [provider]  pravastatin  (PRAVACHOL) 10 MG tablet Take 10 mg by mouth daily.    [provider]  pravastatin (PRAVACHOL) 40 MG tablet Take 40 mg by mouth daily.    [provider]    Allergies    Medrol [methylprednisolone]  Review of Systems   Review of Systems  Constitutional: Negative for fever.  HENT: Negative for rhinorrhea and sore throat.   Eyes: Negative for redness.  Respiratory: Negative for cough.   Cardiovascular: Negative for chest pain.  Gastrointestinal: Positive for abdominal pain and nausea. Negative for blood in stool, constipation, diarrhea and vomiting (only once after medication, no current vomiting).  Genitourinary: Negative for dysuria.  Musculoskeletal: Positive for back pain. Negative for myalgias.  Skin: Positive for rash.  Neurological: Negative for headaches.    Physical Exam Updated Vital Signs BP (!) 144/87   Pulse 63   Temp 98.1 F (36.7 C) (Oral)   Resp 17   Ht 4\' 9"  (1.448 m)   Wt 49 kg   SpO2 100%   BMI 23.37 kg/m   Physical Exam Vitals and nursing note reviewed.  Constitutional:      Appearance: She is well-developed.  HENT:     Head: Normocephalic and atraumatic.  Eyes:     General:        Right eye: No discharge.        Left eye: No discharge.     Conjunctiva/sclera: Conjunctivae normal.  Cardiovascular:     Rate and Rhythm: Normal rate and regular rhythm.     Heart sounds: Normal heart sounds.  Pulmonary:     Effort: Pulmonary effort is normal.     Breath sounds: Normal breath sounds.  Abdominal:     Palpations: Abdomen is soft.     Tenderness: There is abdominal tenderness in the epigastric area and left upper quadrant. There is no guarding or rebound. Negative signs include Murphy's sign, Rovsing's sign and McBurney's sign.     Comments: Patient with mild tenderness to palpation in the epigastrium and left upper quadrant.  Musculoskeletal:     Cervical back: Normal range of motion and neck supple.  Skin:    General: Skin is  warm and dry.     Comments: Patient with irregular area of erythema to the left lower back consistent with allergic contact dermatitis.  Neurological:     Mental Status: She is alert.     ED Results / Procedures / Treatments   Labs (  all labs ordered are listed, but only abnormal results are displayed) Labs Reviewed  LIPASE, BLOOD - Abnormal; Notable for the following components:      Result Value   Lipase 83 (*)    All other components within normal limits  COMPREHENSIVE METABOLIC PANEL - Abnormal; Notable for the following components:   Sodium 133 (*)    Glucose, Bld 118 (*)    All other components within normal limits  URINALYSIS, ROUTINE W REFLEX MICROSCOPIC - Abnormal; Notable for the following components:   Color, Urine STRAW (*)    All other components within normal limits  CBC    EKG None  Radiology CT ABDOMEN PELVIS W CONTRAST  Result Date: 07/11/2019 CLINICAL DATA:  Epigastric pain and back pain radiating to right upper quadrant 3 days. EXAM: CT ABDOMEN AND PELVIS WITH CONTRAST TECHNIQUE: Multidetector CT imaging of the abdomen and pelvis was performed using the standard protocol following bolus administration of intravenous contrast. CONTRAST:  45mL OMNIPAQUE IOHEXOL 300 MG/ML  SOLN COMPARISON:  None. FINDINGS: Lower chest: Lung bases are normal. Hepatobiliary: Liver, gallbladder and biliary tree are normal. Pancreas: Normal. Spleen: Normal. Adrenals/Urinary Tract: Adrenal glands are normal. Kidneys normal size without hydronephrosis or nephrolithiasis. Subcentimeter right renal cortical hypodensity too small to characterize but likely a cyst. Ureters and bladder are normal. Stomach/Bowel: Stomach and small bowel are normal. Appendix is not well visualized. Colon is normal. Vascular/Lymphatic: Abdominal aorta is normal caliber. Remaining vascular structures are unremarkable. No adenopathy. Reproductive: Uterus is normal in size and demonstrates multiple slightly enhancing  masses with the largest over the posterior lower uterine segment measuring 3 cm compatible with leiomyomas. Adnexa unremarkable. Other: No free fluid or focal inflammatory change. Few pelvic phleboliths are present. Musculoskeletal: Mild spondylosis of the spine. IMPRESSION: 1.  No acute findings in the abdomen/pelvis. 2. Multiple uterine leiomyomas with largest measuring 3 cm over the posterior lower uterine segment. 3. Subcentimeter right renal cortical hypodensity too small to characterize but likely a cyst. Electronically Signed   By: Marin Olp M.D.   On: 07/11/2019 12:50    Procedures Procedures (including critical care time)  Medications Ordered in ED Medications  sodium chloride (PF) 0.9 % injection (has no administration in time range)  sodium chloride 0.9 % bolus 1,000 mL (0 mLs Intravenous Stopped 07/11/19 1355)  ondansetron (ZOFRAN) injection 4 mg (4 mg Intravenous Given 07/11/19 1201)  HYDROmorphone (DILAUDID) injection 0.5 mg (0.5 mg Intravenous Given 07/11/19 1201)  iohexol (OMNIPAQUE) 300 MG/ML solution 80 mL (80 mLs Intravenous Contrast Given 07/11/19 1231)    ED Course  I have reviewed the triage vital signs and the nursing notes.  Pertinent labs & imaging results that were available during my care of the patient were reviewed by me and considered in my medical decision making (see chart for details).  Patient seen and examined. Work-up initiated. Medications ordered. Discussed imaging with patient and she would like to proceed.  Given that she has no focal right upper quadrant tenderness, will start with CT imaging of the abdomen pelvis to further evaluate pancreas and upper abdominal organs.  Vital signs reviewed and are as follows: BP (!) 144/87   Pulse 63   Temp 98.1 F (36.7 C) (Oral)   Resp 17   Ht 4\' 9"  (1.448 m)   Wt 49 kg   SpO2 100%   BMI 23.37 kg/m   1:57 PM CT reviewed and does not have any acute findings.  No signs of pancreatitis.  Patient updated  on  results including uterine fibroids.  Discussed possible etiologies of her symptoms.  Discussed that she will require PCP follow-up to ensure improvement.  Will give her empiric treatment for gastritis, duodenitis given her use of NSAIDs and occasional alcohol use.  Patient encouraged to avoid these for the time being and stick with a bland diet.  She may need to have her lipase rechecked to ensure that this normalizes.  She will follow-up with her PCP regarding this.  The patient was urged to return to the Emergency Department immediately with worsening of current symptoms, worsening abdominal pain, persistent vomiting, blood noted in stools, fever, or any other concerns. The patient verbalized understanding.     MDM Rules/Calculators/A&P                          Patient with upper abdominal pain with radiation to the right mid back. Vitals are stable, no fever. Labs show mildly elevated lipase, normal liver for function, normal white blood cell count. Imaging performed without acute findings including no signs of pancreatitis or gallbladder disease. No signs of dehydration, patient is tolerating PO's. Lungs are clear and no signs suggestive of PNA. Low concern for appendicitis, cholecystitis, pancreatitis, ruptured viscus, UTI, kidney stone, aortic dissection, aortic aneurysm or other emergent abdominal etiology.  PE or other pulmonary etiology considered.  Patient does not have hypoxia, shortness of breath, chest pain, tachycardia or clinical signs and symptoms of DVT.  Do not feel that further work-up for this is indicated.  Supportive therapy indicated with return if symptoms worsen.     Final Clinical Impression(s) / ED Diagnoses Final diagnoses:  Epigastric pain  Elevated lipase  Acute right-sided thoracic back pain    Rx / DC Orders ED Discharge Orders         Ordered    omeprazole (PRILOSEC) 20 MG capsule  Daily     Discontinue  Reprint     07/11/19 1350    famotidine (PEPCID) 20 MG  tablet  2 times daily     Discontinue  Reprint     07/11/19 1350    sucralfate (CARAFATE) 1 g tablet  3 times daily with meals & bedtime     Discontinue  Reprint     07/11/19 1350    ondansetron (ZOFRAN ODT) 4 MG disintegrating tablet  Every 8 hours PRN     Discontinue  Reprint     07/11/19 1350    oxyCODONE-acetaminophen (PERCOCET/ROXICET) 5-325 MG tablet  Every 6 hours PRN     Discontinue  Reprint     07/11/19 1350           Carlisle Cater, PA-C 07/11/19 1400    Daleen Bo, MD 07/11/19 2031

## 2019-07-11 NOTE — ED Notes (Signed)
Patient transported to CT 

## 2019-07-11 NOTE — Discharge Instructions (Signed)
Please read and follow all provided instructions.  Your diagnoses today include:  1. Epigastric pain   2. Elevated lipase   3. Acute right-sided thoracic back pain     Tests performed today include:  Blood counts and electrolytes  Blood tests to check liver and kidney function  Blood tests to check pancreas function - shows slightly elevated lipase test  Urine test to look for infection and pregnancy (in women)  CT scan of your abdomen and pelvis - does not show any acute problems.  Pancreas and gallbladder appear normal.  You have some uterine fibroids noted.  Vital signs. See below for your results today.   Medications prescribed:   Omeprazole (Prilosec) - stomach acid reducer  This medication can be found over-the-counter   Pepcid (famotidine) - antihistamine  You can find this medication over-the-counter.   DO NOT exceed:   20mg  Pepcid every 12 hours   Zofran (ondansetron) - for nausea and vomiting   Oxycodone - narcotic pain medication  DO NOT drive or perform any activities that require you to be awake and alert because this medicine can make you drowsy.    Zofran (ondansetron) - for nausea and vomiting  Take any prescribed medications only as directed.  Home care instructions:   Follow any educational materials contained in this packet.  Follow-up instructions: Please follow-up with your primary care provider in the next 3 days for further evaluation of your symptoms.    Return instructions:  SEEK IMMEDIATE MEDICAL ATTENTION IF:  The pain does not go away or becomes severe   A temperature above 101F develops   Repeated vomiting occurs (multiple episodes)   The pain becomes localized to portions of the abdomen. The right side could possibly be appendicitis. In an adult, the left lower portion of the abdomen could be colitis or diverticulitis.   Blood is being passed in stools or vomit (bright red or black tarry stools)   You develop chest  pain, difficulty breathing, dizziness or fainting, or become confused, poorly responsive, or inconsolable (young children)  If you have any other emergent concerns regarding your health  Additional Information: Abdominal (belly) pain can be caused by many things. Your caregiver performed an examination and possibly ordered blood/urine tests and imaging (CT scan, x-rays, ultrasound). Many cases can be observed and treated at home after initial evaluation in the emergency department. Even though you are being discharged home, abdominal pain can be unpredictable. Therefore, you need a repeated exam if your pain does not resolve, returns, or worsens. Most patients with abdominal pain don't have to be admitted to the hospital or have surgery, but serious problems like appendicitis and gallbladder attacks can start out as nonspecific pain. Many abdominal conditions cannot be diagnosed in one visit, so follow-up evaluations are very important.  Your vital signs today were: BP (!) 144/87   Pulse 63   Temp 98.1 F (36.7 C) (Oral)   Resp 17   Ht 4\' 9"  (1.448 m)   Wt 49 kg   SpO2 100%   BMI 23.37 kg/m  If your blood pressure (bp) was elevated above 135/85 this visit, please have this repeated by your doctor within one month. --------------

## 2019-07-13 DIAGNOSIS — K5900 Constipation, unspecified: Secondary | ICD-10-CM | POA: Diagnosis not present

## 2019-07-13 DIAGNOSIS — R0781 Pleurodynia: Secondary | ICD-10-CM | POA: Diagnosis not present

## 2019-07-13 DIAGNOSIS — R748 Abnormal levels of other serum enzymes: Secondary | ICD-10-CM | POA: Diagnosis not present

## 2019-07-21 DIAGNOSIS — R748 Abnormal levels of other serum enzymes: Secondary | ICD-10-CM | POA: Diagnosis not present

## 2019-07-27 DIAGNOSIS — F419 Anxiety disorder, unspecified: Secondary | ICD-10-CM | POA: Diagnosis not present

## 2019-07-30 DIAGNOSIS — F419 Anxiety disorder, unspecified: Secondary | ICD-10-CM | POA: Diagnosis not present

## 2019-08-10 DIAGNOSIS — F419 Anxiety disorder, unspecified: Secondary | ICD-10-CM | POA: Diagnosis not present

## 2019-08-24 DIAGNOSIS — F419 Anxiety disorder, unspecified: Secondary | ICD-10-CM | POA: Diagnosis not present

## 2019-09-17 DIAGNOSIS — F419 Anxiety disorder, unspecified: Secondary | ICD-10-CM | POA: Diagnosis not present

## 2019-09-25 DIAGNOSIS — F419 Anxiety disorder, unspecified: Secondary | ICD-10-CM | POA: Diagnosis not present

## 2019-10-15 DIAGNOSIS — F419 Anxiety disorder, unspecified: Secondary | ICD-10-CM | POA: Diagnosis not present

## 2019-10-29 ENCOUNTER — Ambulatory Visit: Payer: Self-pay | Admitting: Cardiology

## 2019-10-29 DIAGNOSIS — R002 Palpitations: Secondary | ICD-10-CM | POA: Insufficient documentation

## 2019-10-29 NOTE — Progress Notes (Signed)
No show

## 2019-11-02 ENCOUNTER — Ambulatory Visit: Payer: BC Managed Care – PPO | Admitting: Cardiology

## 2019-11-02 ENCOUNTER — Encounter: Payer: Self-pay | Admitting: Cardiology

## 2019-11-02 ENCOUNTER — Ambulatory Visit: Payer: BC Managed Care – PPO

## 2019-11-02 ENCOUNTER — Other Ambulatory Visit: Payer: Self-pay

## 2019-11-02 VITALS — BP 139/81 | HR 68 | Resp 17 | Ht <= 58 in | Wt 116.6 lb

## 2019-11-02 DIAGNOSIS — R002 Palpitations: Secondary | ICD-10-CM

## 2019-11-02 DIAGNOSIS — Z8249 Family history of ischemic heart disease and other diseases of the circulatory system: Secondary | ICD-10-CM | POA: Diagnosis not present

## 2019-11-02 DIAGNOSIS — R9431 Abnormal electrocardiogram [ECG] [EKG]: Secondary | ICD-10-CM | POA: Diagnosis not present

## 2019-11-02 NOTE — Progress Notes (Signed)
Follow up visit  Subjective:   Christina Vargas, female    DOB: 07-09-1960, 59 y.o.   MRN: [id number]    HPI  Chief Complaint  Patient presents with  . Palpitations  . Chest Pain  . Arm Pain  . Follow-up    59 y.o. Caucasian female with hyperlipidemia, klippel Feil syndrome, family history of premature CAD, now here with complaints of heart racing, chest pain  Episodes of heart racing and retrosternal chest pressure lasting for few seconds, unrelated to exertion.  Patient is active, exercises regularly.  She does not have any of these episodes with physical exertion.  Previous work-up showed normal exercise nuclear stress test 2019.   Current Outpatient Medications on File Prior to Visit  Medication Sig Dispense Refill  . aspirin 81 MG tablet Take 81 mg by mouth daily.    . calcium citrate-vitamin D (CITRACAL+D) 315-200 MG-UNIT per tablet Take 1 tablet by mouth daily.    . cephALEXin (KEFLEX) 500 MG capsule Take 1 capsule (500 mg total) by mouth 3 (three) times daily. 30 capsule 0  . famotidine (PEPCID) 20 MG tablet Take 1 tablet (20 mg total) by mouth 2 (two) times daily. 30 tablet 0  . meloxicam (MOBIC) 7.5 MG tablet   2  . methylPREDNISolone (MEDROL DOSEPAK) 4 MG TBPK tablet 6 day dose pack - take as directed 21 tablet 0  . omeprazole (PRILOSEC) 20 MG capsule Take 1 capsule (20 mg total) by mouth daily. 30 capsule 0  . ondansetron (ZOFRAN ODT) 4 MG disintegrating tablet Take 1 tablet (4 mg total) by mouth every 8 (eight) hours as needed for nausea or vomiting. 10 tablet 0  . oxyCODONE-acetaminophen (PERCOCET/ROXICET) 5-325 MG tablet Take 1 tablet by mouth every 6 (six) hours as needed for severe pain. 6 tablet 0  . PARoxetine Mesylate 7.5 MG CAPS Take 1 capsule by mouth at bedtime.    . pravastatin (PRAVACHOL) 10 MG tablet Take 10 mg by mouth daily.    . pravastatin (PRAVACHOL) 40 MG tablet Take 40 mg by mouth daily.    . sucralfate (CARAFATE) 1 g tablet Take 1 tablet (1  g total) by mouth 4 (four) times daily -  with meals and at bedtime. 60 tablet 0   No current facility-administered medications on file prior to visit.    Cardiovascular & other pertient studies:  EKG 11/02/2019: Sinus rhythm 64 bpm  Nonspecific T-abnormality  Exercise nuclear stress test 06/2017: 10 METS. No ischemia.  EF 47%. Low risk study.  CT abdomen/pelvis 07/11/2019: 1.  No acute findings in the abdomen/pelvis. 2. Multiple uterine leiomyomas with largest measuring 3 cm over the posterior lower uterine segment. 3. Subcentimeter right renal cortical hypodensity too small to characterize but likely a cyst.   Recent labs: 07/11/2019: Glucose 118, BUN/Cr 19/0.81. EGFR >60. Na/K 133/3.7. Rest of the CMP normal H/H 13/41. MCV 88. Platelets 319   Review of Systems  Cardiovascular: Positive for chest pain and palpitations. Negative for dyspnea on exertion, leg swelling and syncope.         Vitals:   11/02/19 1351  BP: 139/81  Pulse: 68  Resp: 17  SpO2: 98%    Body mass index is 25.23 kg/m. Filed Weights   11/02/19 1351  Weight: 116 lb 9.6 oz (52.9 kg)     Objective:   Physical Exam Vitals and nursing note reviewed.  Constitutional:      General: She is not in acute distress. Neck:  Vascular: No JVD.  Cardiovascular:     Rate and Rhythm: Normal rate and regular rhythm.     Heart sounds: Normal heart sounds. No murmur heard.   Pulmonary:     Effort: Pulmonary effort is normal.     Breath sounds: Normal breath sounds. No wheezing or rales.           Assessment & Recommendations:   59 y.o. Caucasian female with hyperlipidemia, klippel Feil syndrome, family history of premature CAD, now here with complaints of heart racing, chest pain  Chest pain: Non-anginal. Normal stress test in 2019.   Palpitations: Recommend echocardiogram and 1 week monitor.  Cardiac risk stratification: Strong family h/o early CAD. Will obtain calcium score scan.      Nigel Mormon, MD Pager: [phone] Office: [phone]

## 2019-11-05 DIAGNOSIS — R002 Palpitations: Secondary | ICD-10-CM | POA: Diagnosis not present

## 2019-11-10 ENCOUNTER — Other Ambulatory Visit: Payer: Self-pay

## 2019-11-10 ENCOUNTER — Ambulatory Visit: Payer: BC Managed Care – PPO

## 2019-11-10 DIAGNOSIS — R9431 Abnormal electrocardiogram [ECG] [EKG]: Secondary | ICD-10-CM

## 2019-11-10 DIAGNOSIS — R002 Palpitations: Secondary | ICD-10-CM

## 2019-11-11 NOTE — Progress Notes (Signed)
Your patient, mistakenly sent to me.

## 2019-11-13 DIAGNOSIS — R002 Palpitations: Secondary | ICD-10-CM | POA: Diagnosis not present

## 2019-11-18 DIAGNOSIS — R002 Palpitations: Secondary | ICD-10-CM | POA: Diagnosis not present

## 2019-11-25 DIAGNOSIS — F419 Anxiety disorder, unspecified: Secondary | ICD-10-CM | POA: Diagnosis not present

## 2019-11-26 ENCOUNTER — Encounter: Payer: Self-pay | Admitting: Cardiology

## 2019-11-26 ENCOUNTER — Other Ambulatory Visit: Payer: Self-pay

## 2019-11-26 ENCOUNTER — Ambulatory Visit: Payer: BC Managed Care – PPO | Admitting: Cardiology

## 2019-11-26 VITALS — BP 137/78 | HR 78 | Resp 16 | Ht <= 58 in | Wt 117.0 lb

## 2019-11-26 DIAGNOSIS — I493 Ventricular premature depolarization: Secondary | ICD-10-CM | POA: Diagnosis not present

## 2019-11-26 DIAGNOSIS — Z13220 Encounter for screening for lipoid disorders: Secondary | ICD-10-CM

## 2019-11-26 DIAGNOSIS — Z131 Encounter for screening for diabetes mellitus: Secondary | ICD-10-CM

## 2019-11-26 NOTE — Progress Notes (Addendum)
Follow up visit  Subjective:   Christina Vargas, female    DOB: 09/04/1960, 59 y.o.   MRN: [id number]    HPI  Chief Complaint  Patient presents with   Palpitations   Follow-up   Results    59 y.o. Caucasian female with hyperlipidemia, klippel Feil syndrome, family history of premature CAD, symptomatic PVCs  Workup details below.   Current Outpatient Medications on File Prior to Visit  Medication Sig Dispense Refill   calcium citrate-vitamin D (CITRACAL+D) 315-200 MG-UNIT per tablet Take 1 tablet by mouth daily.     meloxicam (MOBIC) 7.5 MG tablet   2   pravastatin (PRAVACHOL) 40 MG tablet Take 40 mg by mouth daily.     No current facility-administered medications on file prior to visit.    Cardiovascular & other pertient studies:  Echocardiogram 11/10/2019:  Left ventricle cavity is normal in size and wall thickness. Normal global  wall motion. Normal LV systolic function with EF 58%. Normal diastolic  filling pattern.  No significant valvular abnormality.   CT Cardiac scoring 11/05/2019: Calcium score 0  Mobile cardiac telemetry 2 days 11/05/2019 - 11/09/2019: Dominant rhythm: Sinus. HR 52-182 bpm. Avg HR 79 bpm. 2 episodes of SVT, fastest at 182 bpm for 5 beats, longest for 6 beats at 122 bpm. Rare isolated SVE, couplet/triplets. Rare isolated VE, couplet/triplets. No atrial fibrillation/atrial flutter/VT/high grade AV block, sinus pause >3sec noted. 20 patient triggered events, most correlated with VE  Mobile cardiac telemetry 2 days 11/02/2019 - 11/05/2019: Assessment limited due to limited usage. Dominant rhythm: Sinus. HR 52-122 bpm. Avg HR 78 bpm. <1% isolated PAC/PVC, rare PAC No atrial fibrillation/atrial flutter/SVT/VT/high grade AV block, sinus pause >3sec noted. 0 patient triggered events.    Exercise nuclear stress test 06/2017: 10 METS. No ischemia.  EF 47%. Low risk study.  CT abdomen/pelvis 07/11/2019: 1.  No acute findings in the  abdomen/pelvis. 2. Multiple uterine leiomyomas with largest measuring 3 cm over the posterior lower uterine segment. 3. Subcentimeter right renal cortical hypodensity too small to characterize but likely a cyst.   Recent labs: 07/11/2019: Glucose 118, BUN/Cr 19/0.81. EGFR >60. Na/K 133/3.7. Rest of the CMP normal H/H 13/41. MCV 88. Platelets 319   Review of Systems  Cardiovascular: Positive for chest pain and palpitations. Negative for dyspnea on exertion, leg swelling and syncope.        Vitals:   11/26/19 1521  BP: 137/78  Pulse: 78  Resp: 16  SpO2: 99%     Body mass index is 25.32 kg/m. Filed Weights   11/26/19 1521  Weight: 117 lb (53.1 kg)     Objective:   Physical Exam Vitals and nursing note reviewed.  Constitutional:      General: She is not in acute distress. Neck:     Vascular: No JVD.  Cardiovascular:     Rate and Rhythm: Normal rate and regular rhythm.     Heart sounds: Normal heart sounds. No murmur heard.   Pulmonary:     Effort: Pulmonary effort is normal.     Breath sounds: Normal breath sounds. No wheezing or rales.           Assessment & Recommendations:   59 y.o. Caucasian female with hyperlipidemia, klippel Feil syndrome, family history of premature CAD, now here with complaints of heart racing, chest pain  Chest pain: Non-anginal. Normal stress test in 2019. Calcium score 0  Symptomatic PVCs: Low PVC burden. Caffeine likely trigger. Consider reducing intake. She is reluctant  to start any medications, but reassured to know the diagnosis.  Check HbA1C and lipid panel for screening   Nigel Mormon, MD Pager: [phone] Office: [phone]

## 2019-11-26 NOTE — Addendum Note (Signed)
Addended by: Nigel Mormon on: 11/26/2019 03:57 PM   Modules accepted: Orders

## 2019-12-02 DIAGNOSIS — H532 Diplopia: Secondary | ICD-10-CM | POA: Diagnosis not present

## 2019-12-07 DIAGNOSIS — F419 Anxiety disorder, unspecified: Secondary | ICD-10-CM | POA: Diagnosis not present

## 2019-12-16 DIAGNOSIS — F419 Anxiety disorder, unspecified: Secondary | ICD-10-CM | POA: Diagnosis not present

## 2019-12-16 DIAGNOSIS — R52 Pain, unspecified: Secondary | ICD-10-CM | POA: Diagnosis not present

## 2019-12-16 DIAGNOSIS — B029 Zoster without complications: Secondary | ICD-10-CM | POA: Diagnosis not present

## 2019-12-29 DIAGNOSIS — F419 Anxiety disorder, unspecified: Secondary | ICD-10-CM | POA: Diagnosis not present

## 2019-12-31 ENCOUNTER — Other Ambulatory Visit: Payer: Self-pay | Admitting: Internal Medicine

## 2019-12-31 DIAGNOSIS — D329 Benign neoplasm of meninges, unspecified: Secondary | ICD-10-CM

## 2020-01-19 DIAGNOSIS — F419 Anxiety disorder, unspecified: Secondary | ICD-10-CM | POA: Diagnosis not present

## 2020-01-25 ENCOUNTER — Other Ambulatory Visit: Payer: Self-pay | Admitting: Internal Medicine

## 2020-01-27 ENCOUNTER — Other Ambulatory Visit: Payer: Self-pay | Admitting: Internal Medicine

## 2020-02-02 ENCOUNTER — Other Ambulatory Visit: Payer: Self-pay | Admitting: Internal Medicine

## 2020-02-04 ENCOUNTER — Other Ambulatory Visit: Payer: Self-pay | Admitting: Internal Medicine

## 2020-02-04 DIAGNOSIS — Z8489 Family history of other specified conditions: Secondary | ICD-10-CM

## 2020-02-08 DIAGNOSIS — F419 Anxiety disorder, unspecified: Secondary | ICD-10-CM | POA: Diagnosis not present

## 2020-02-17 ENCOUNTER — Ambulatory Visit
Admission: RE | Admit: 2020-02-17 | Discharge: 2020-02-17 | Disposition: A | Discharge status: Home / Self Care | Payer: BC Managed Care – PPO | Source: Ambulatory Visit | Attending: Internal Medicine | Admitting: Internal Medicine

## 2020-02-17 DIAGNOSIS — Z8489 Family history of other specified conditions: Secondary | ICD-10-CM

## 2020-02-17 DIAGNOSIS — Z9621 Cochlear implant status: Secondary | ICD-10-CM | POA: Diagnosis not present

## 2020-02-22 DIAGNOSIS — F419 Anxiety disorder, unspecified: Secondary | ICD-10-CM | POA: Diagnosis not present

## 2020-03-01 DIAGNOSIS — L573 Poikiloderma of Civatte: Secondary | ICD-10-CM | POA: Diagnosis not present

## 2020-03-01 DIAGNOSIS — D2271 Melanocytic nevi of right lower limb, including hip: Secondary | ICD-10-CM | POA: Diagnosis not present

## 2020-03-01 DIAGNOSIS — L814 Other melanin hyperpigmentation: Secondary | ICD-10-CM | POA: Diagnosis not present

## 2020-03-01 DIAGNOSIS — D2272 Melanocytic nevi of left lower limb, including hip: Secondary | ICD-10-CM | POA: Diagnosis not present

## 2020-03-09 DIAGNOSIS — M8580 Other specified disorders of bone density and structure, unspecified site: Secondary | ICD-10-CM | POA: Diagnosis not present

## 2020-03-31 DIAGNOSIS — M20021 Boutonniere deformity of right finger(s): Secondary | ICD-10-CM | POA: Diagnosis not present

## 2020-03-31 DIAGNOSIS — M79641 Pain in right hand: Secondary | ICD-10-CM | POA: Diagnosis not present

## 2020-04-25 DIAGNOSIS — M25641 Stiffness of right hand, not elsewhere classified: Secondary | ICD-10-CM | POA: Diagnosis not present

## 2020-04-25 DIAGNOSIS — M20021 Boutonniere deformity of right finger(s): Secondary | ICD-10-CM | POA: Diagnosis not present

## 2020-05-05 DIAGNOSIS — M25641 Stiffness of right hand, not elsewhere classified: Secondary | ICD-10-CM | POA: Diagnosis not present

## 2020-05-08 DIAGNOSIS — Z20822 Contact with and (suspected) exposure to covid-19: Secondary | ICD-10-CM | POA: Diagnosis not present

## 2020-05-20 DIAGNOSIS — M25641 Stiffness of right hand, not elsewhere classified: Secondary | ICD-10-CM | POA: Diagnosis not present

## 2020-05-24 DIAGNOSIS — M20021 Boutonniere deformity of right finger(s): Secondary | ICD-10-CM | POA: Diagnosis not present

## 2020-05-27 DIAGNOSIS — Z1231 Encounter for screening mammogram for malignant neoplasm of breast: Secondary | ICD-10-CM | POA: Diagnosis not present

## 2020-05-27 DIAGNOSIS — Z01419 Encounter for gynecological examination (general) (routine) without abnormal findings: Secondary | ICD-10-CM | POA: Diagnosis not present

## 2020-05-27 DIAGNOSIS — M810 Age-related osteoporosis without current pathological fracture: Secondary | ICD-10-CM | POA: Diagnosis not present

## 2020-05-27 DIAGNOSIS — Z6825 Body mass index (BMI) 25.0-25.9, adult: Secondary | ICD-10-CM | POA: Diagnosis not present

## 2020-06-26 DIAGNOSIS — Z20822 Contact with and (suspected) exposure to covid-19: Secondary | ICD-10-CM | POA: Diagnosis not present

## 2020-09-07 IMAGING — CT CT ABD-PELV W/ CM
2 of 5 series · 16 of 46 positions shown, 18 images · IV contrast (omnipaque)
Comparison: None.

CLINICAL DATA: Epigastric pain and back pain radiating to right
upper quadrant 3 days.

EXAM:
CT ABDOMEN AND PELVIS WITH CONTRAST
TECHNIQUE: Multidetector CT imaging of the abdomen and pelvis was performed
using the standard protocol following bolus administration of
intravenous contrast.
CONTRAST:  80mL OMNIPAQUE IOHEXOL 300 MG/ML  SOLN

[Series 2: axial st · axial · 0.71mm/px · z∈[-596,-246]mm · 13 of 82 slices shown, 15 images]
[im 6/82  soft-tissue]
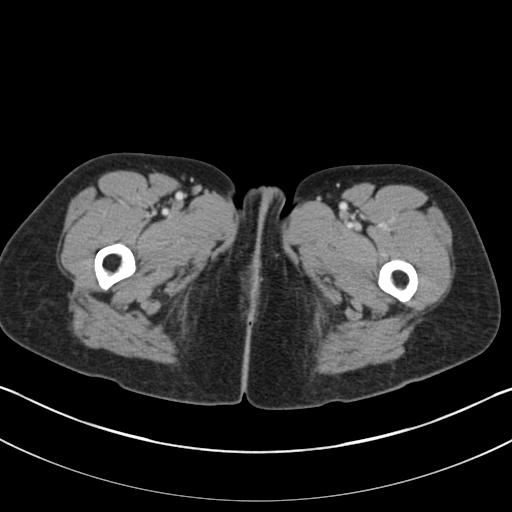
[im 6/82  bone]
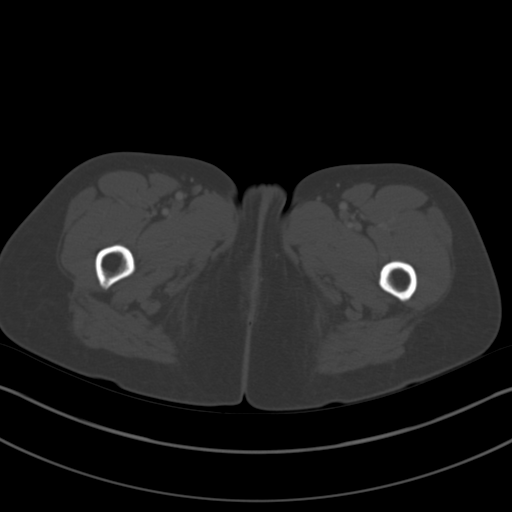
[im 12/82  soft-tissue]
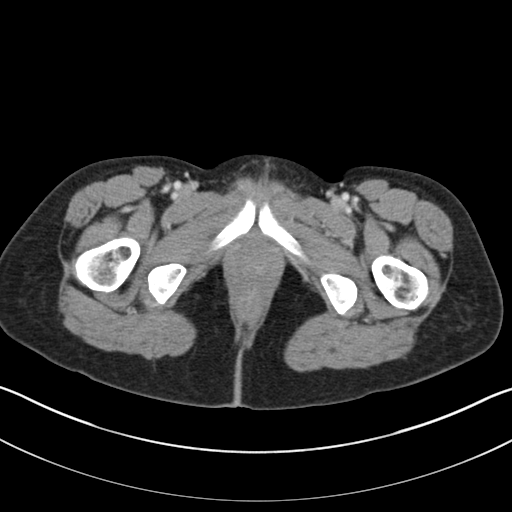
[im 18/82  soft-tissue]
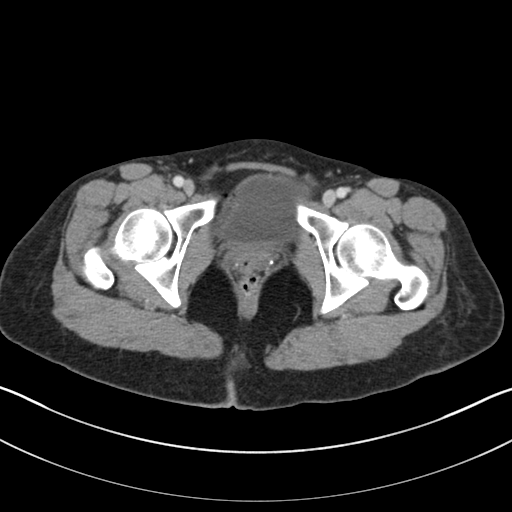
[im 24/82  soft-tissue]
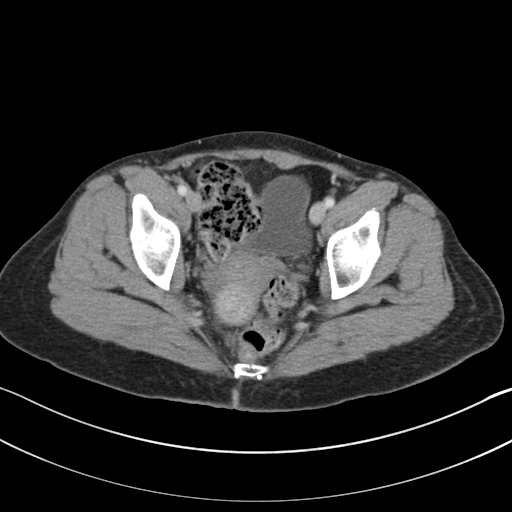
[im 29/82  soft-tissue]
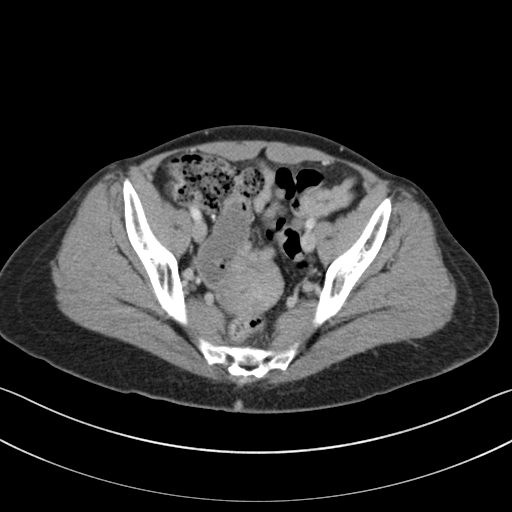
[im 35/82  soft-tissue]
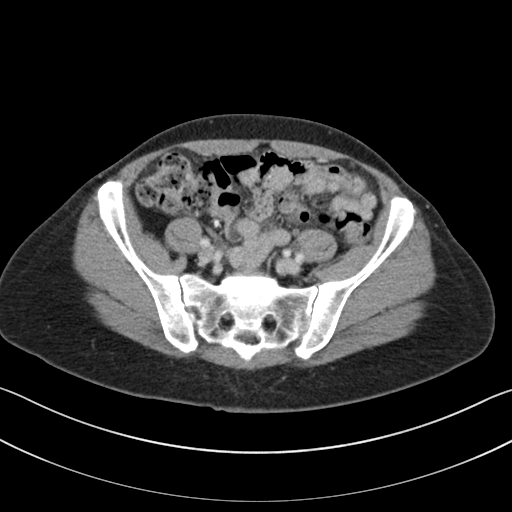
[im 41/82  soft-tissue]
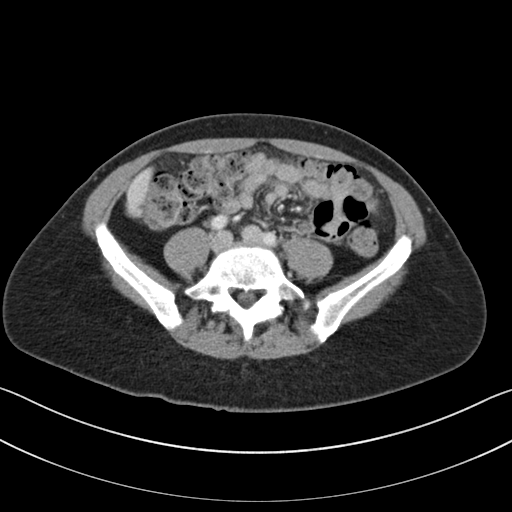
[im 47/82  soft-tissue]
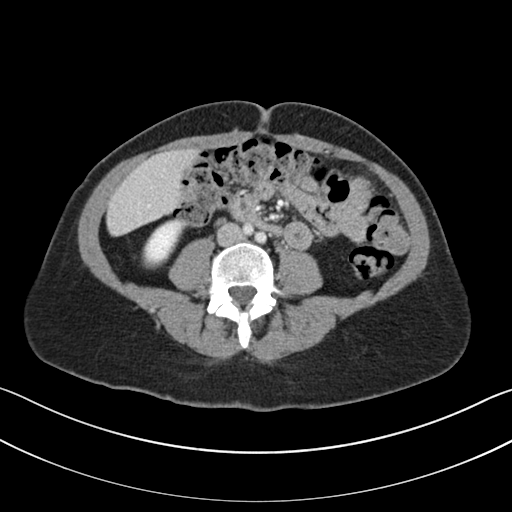
[im 53/82  soft-tissue]
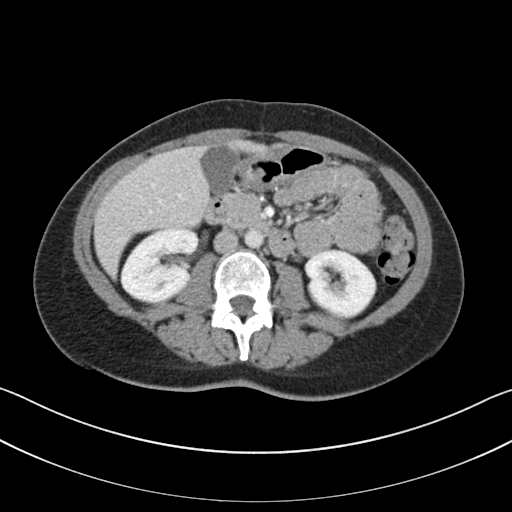
[im 53/82  bone]
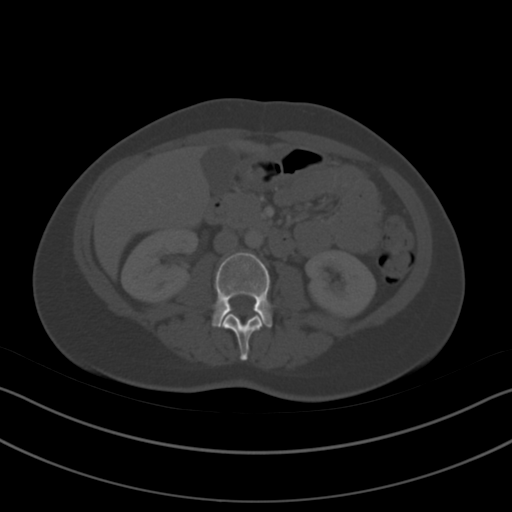
[im 58/82  soft-tissue]
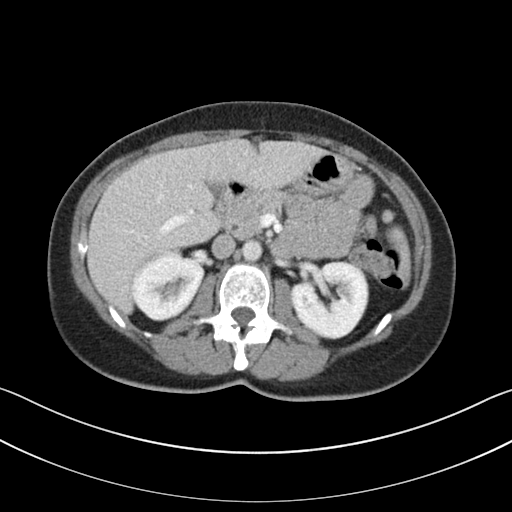
[im 64/82  soft-tissue]
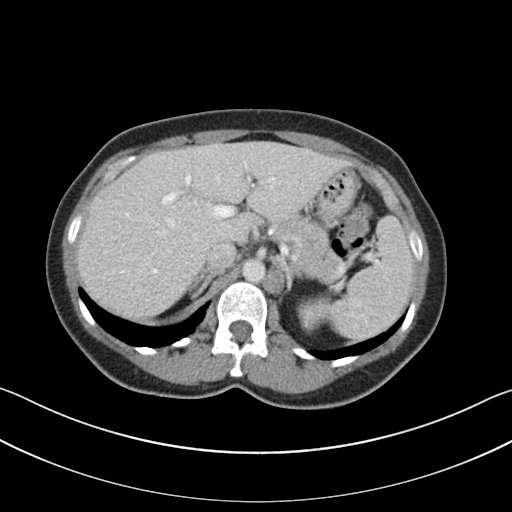
[im 70/82  soft-tissue]
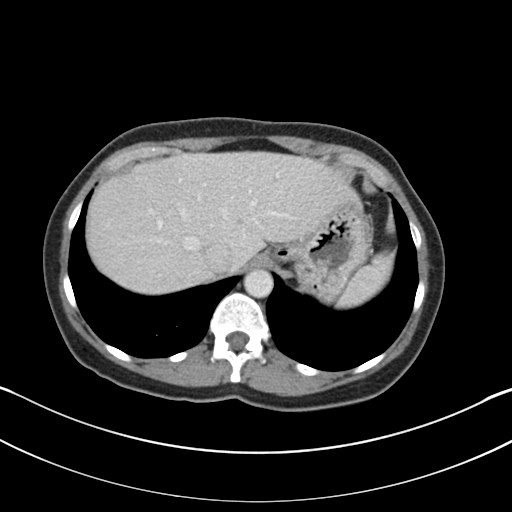
[im 76/82  soft-tissue]
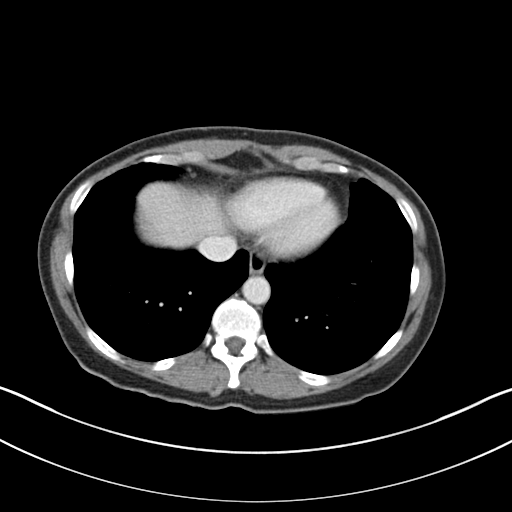

[Series 4: coronal st · coronal · 0.77mm/px · 3 of 107 slices shown]
[im 36/107  soft-tissue]
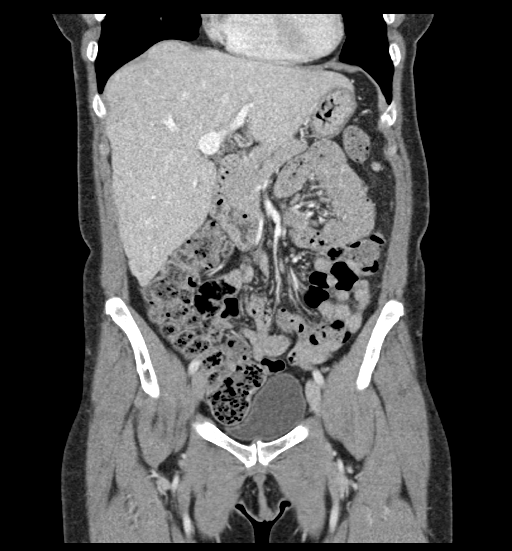
[im 48/107  soft-tissue]
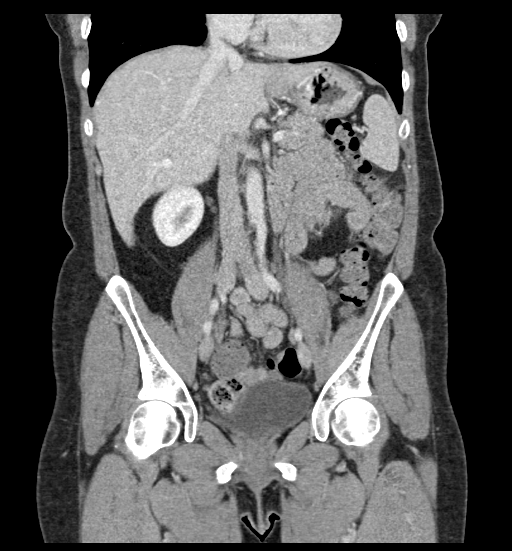
[im 59/107  soft-tissue]
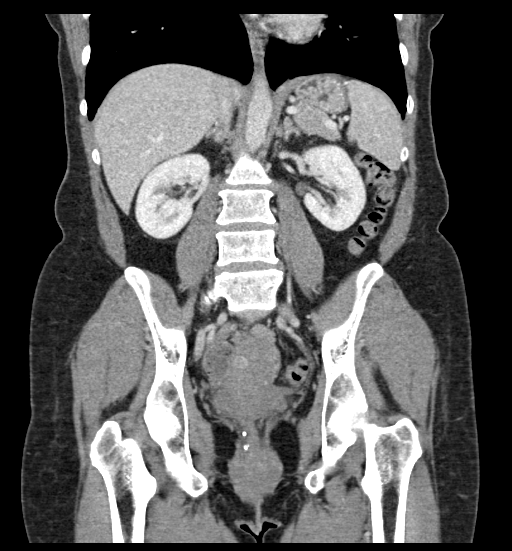

[16 of 46 positions shown; findings below may reference images not displayed]

FINDINGS: Lower chest: Lung bases are normal.

Hepatobiliary: Liver, gallbladder and biliary tree are normal.

Pancreas: Normal.

Spleen: Normal.

Adrenals/Urinary Tract: Adrenal glands are normal. Kidneys normal
size without hydronephrosis or nephrolithiasis. Subcentimeter right
renal cortical hypodensity too small to characterize but likely a
cyst. Ureters and bladder are normal.

Stomach/Bowel: Stomach and small bowel are normal. Appendix is not
well visualized. Colon is normal.

Vascular/Lymphatic: Abdominal aorta is normal caliber. Remaining
vascular structures are unremarkable. No adenopathy.

Reproductive: Uterus is normal in size and demonstrates multiple
slightly enhancing masses with the largest over the posterior lower
uterine segment measuring 3 cm compatible with leiomyomas. Adnexa
unremarkable.

Other: No free fluid or focal inflammatory change. Few pelvic
phleboliths are present.

Musculoskeletal: Mild spondylosis of the spine.
IMPRESSION: 1.  No acute findings in the abdomen/pelvis.

2. Multiple uterine leiomyomas with largest measuring 3 cm over the
posterior lower uterine segment.

3. Subcentimeter right renal cortical hypodensity too small to
characterize but likely a cyst.

## 2020-10-20 DIAGNOSIS — Z20828 Contact with and (suspected) exposure to other viral communicable diseases: Secondary | ICD-10-CM | POA: Diagnosis not present

## 2020-10-20 DIAGNOSIS — Z20822 Contact with and (suspected) exposure to covid-19: Secondary | ICD-10-CM | POA: Diagnosis not present

## 2020-11-30 DIAGNOSIS — Z461 Encounter for fitting and adjustment of hearing aid: Secondary | ICD-10-CM | POA: Diagnosis not present

## 2020-11-30 DIAGNOSIS — H9072 Mixed conductive and sensorineural hearing loss, unilateral, left ear, with unrestricted hearing on the contralateral side: Secondary | ICD-10-CM | POA: Diagnosis not present

## 2020-12-15 DIAGNOSIS — F419 Anxiety disorder, unspecified: Secondary | ICD-10-CM | POA: Diagnosis not present

## 2021-01-16 DIAGNOSIS — F419 Anxiety disorder, unspecified: Secondary | ICD-10-CM | POA: Diagnosis not present

## 2021-01-30 DIAGNOSIS — E782 Mixed hyperlipidemia: Secondary | ICD-10-CM | POA: Diagnosis not present

## 2021-02-13 DIAGNOSIS — F419 Anxiety disorder, unspecified: Secondary | ICD-10-CM | POA: Diagnosis not present

## 2021-02-17 DIAGNOSIS — Z20822 Contact with and (suspected) exposure to covid-19: Secondary | ICD-10-CM | POA: Diagnosis not present

## 2021-06-26 ENCOUNTER — Telehealth: Payer: Self-pay

## 2021-06-26 NOTE — Telephone Encounter (Signed)
Tried calling patient no answer left a detailed voice message per patient's request when we spoke earlier today

## 2021-06-26 NOTE — Telephone Encounter (Signed)
Patient had calcium score test that showed 0 score in 10/2019. That is very reassuring. Typically, calcium score scan does not need to be repeated for at least 5 years.  Thanks MJP

## 2021-07-04 DIAGNOSIS — E559 Vitamin D deficiency, unspecified: Secondary | ICD-10-CM | POA: Diagnosis not present

## 2021-07-04 DIAGNOSIS — E782 Mixed hyperlipidemia: Secondary | ICD-10-CM | POA: Diagnosis not present

## 2021-07-11 DIAGNOSIS — Z1339 Encounter for screening examination for other mental health and behavioral disorders: Secondary | ICD-10-CM | POA: Diagnosis not present

## 2021-07-11 DIAGNOSIS — Z1331 Encounter for screening for depression: Secondary | ICD-10-CM | POA: Diagnosis not present

## 2021-07-11 DIAGNOSIS — E782 Mixed hyperlipidemia: Secondary | ICD-10-CM | POA: Diagnosis not present

## 2021-07-11 DIAGNOSIS — Z0000 Encounter for general adult medical examination without abnormal findings: Secondary | ICD-10-CM | POA: Diagnosis not present

## 2021-07-11 DIAGNOSIS — Z23 Encounter for immunization: Secondary | ICD-10-CM | POA: Diagnosis not present

## 2021-08-17 DIAGNOSIS — H31103 Choroidal degeneration, unspecified, bilateral: Secondary | ICD-10-CM | POA: Diagnosis not present

## 2021-08-17 DIAGNOSIS — H35373 Puckering of macula, bilateral: Secondary | ICD-10-CM | POA: Diagnosis not present

## 2021-08-17 DIAGNOSIS — H11432 Conjunctival hyperemia, left eye: Secondary | ICD-10-CM | POA: Diagnosis not present

## 2021-08-17 DIAGNOSIS — H43393 Other vitreous opacities, bilateral: Secondary | ICD-10-CM | POA: Diagnosis not present

## 2021-08-17 DIAGNOSIS — H35363 Drusen (degenerative) of macula, bilateral: Secondary | ICD-10-CM | POA: Diagnosis not present

## 2021-08-17 DIAGNOSIS — H04123 Dry eye syndrome of bilateral lacrimal glands: Secondary | ICD-10-CM | POA: Diagnosis not present

## 2021-08-30 DIAGNOSIS — R140 Abdominal distension (gaseous): Secondary | ICD-10-CM | POA: Diagnosis not present

## 2021-08-30 DIAGNOSIS — Z1211 Encounter for screening for malignant neoplasm of colon: Secondary | ICD-10-CM | POA: Diagnosis not present

## 2021-08-30 DIAGNOSIS — K588 Other irritable bowel syndrome: Secondary | ICD-10-CM | POA: Diagnosis not present

## 2021-09-26 DIAGNOSIS — Z1211 Encounter for screening for malignant neoplasm of colon: Secondary | ICD-10-CM | POA: Diagnosis not present

## 2021-10-26 DIAGNOSIS — L573 Poikiloderma of Civatte: Secondary | ICD-10-CM | POA: Diagnosis not present

## 2021-10-26 DIAGNOSIS — L568 Other specified acute skin changes due to ultraviolet radiation: Secondary | ICD-10-CM | POA: Diagnosis not present

## 2021-10-26 DIAGNOSIS — D225 Melanocytic nevi of trunk: Secondary | ICD-10-CM | POA: Diagnosis not present

## 2021-10-26 DIAGNOSIS — L821 Other seborrheic keratosis: Secondary | ICD-10-CM | POA: Diagnosis not present

## 2021-12-28 DIAGNOSIS — Z01419 Encounter for gynecological examination (general) (routine) without abnormal findings: Secondary | ICD-10-CM | POA: Diagnosis not present

## 2021-12-28 DIAGNOSIS — Z1151 Encounter for screening for human papillomavirus (HPV): Secondary | ICD-10-CM | POA: Diagnosis not present

## 2021-12-28 DIAGNOSIS — Z6826 Body mass index (BMI) 26.0-26.9, adult: Secondary | ICD-10-CM | POA: Diagnosis not present

## 2021-12-28 DIAGNOSIS — Z124 Encounter for screening for malignant neoplasm of cervix: Secondary | ICD-10-CM | POA: Diagnosis not present

## 2021-12-28 DIAGNOSIS — Z1231 Encounter for screening mammogram for malignant neoplasm of breast: Secondary | ICD-10-CM | POA: Diagnosis not present

## 2021-12-28 DIAGNOSIS — Z13820 Encounter for screening for osteoporosis: Secondary | ICD-10-CM | POA: Diagnosis not present

## 2022-12-16 DIAGNOSIS — Z20818 Contact with and (suspected) exposure to other bacterial communicable diseases: Secondary | ICD-10-CM | POA: Diagnosis not present

## 2022-12-16 DIAGNOSIS — R519 Headache, unspecified: Secondary | ICD-10-CM | POA: Diagnosis not present

## 2022-12-16 DIAGNOSIS — Z20822 Contact with and (suspected) exposure to covid-19: Secondary | ICD-10-CM | POA: Diagnosis not present

## 2022-12-16 DIAGNOSIS — J029 Acute pharyngitis, unspecified: Secondary | ICD-10-CM | POA: Diagnosis not present

## 2022-12-16 DIAGNOSIS — R112 Nausea with vomiting, unspecified: Secondary | ICD-10-CM | POA: Diagnosis not present

## 2022-12-20 DIAGNOSIS — B029 Zoster without complications: Secondary | ICD-10-CM | POA: Diagnosis not present

## 2022-12-20 DIAGNOSIS — R519 Headache, unspecified: Secondary | ICD-10-CM | POA: Diagnosis not present

## 2022-12-20 DIAGNOSIS — R0981 Nasal congestion: Secondary | ICD-10-CM | POA: Diagnosis not present

## 2022-12-26 DIAGNOSIS — Z01818 Encounter for other preprocedural examination: Secondary | ICD-10-CM | POA: Diagnosis not present

## 2022-12-26 DIAGNOSIS — Z01419 Encounter for gynecological examination (general) (routine) without abnormal findings: Secondary | ICD-10-CM | POA: Diagnosis not present

## 2023-01-21 DIAGNOSIS — E559 Vitamin D deficiency, unspecified: Secondary | ICD-10-CM | POA: Diagnosis not present

## 2023-01-21 DIAGNOSIS — E782 Mixed hyperlipidemia: Secondary | ICD-10-CM | POA: Diagnosis not present

## 2023-01-21 DIAGNOSIS — Z1212 Encounter for screening for malignant neoplasm of rectum: Secondary | ICD-10-CM | POA: Diagnosis not present

## 2023-01-23 DIAGNOSIS — Z1389 Encounter for screening for other disorder: Secondary | ICD-10-CM | POA: Diagnosis not present

## 2023-01-23 DIAGNOSIS — E782 Mixed hyperlipidemia: Secondary | ICD-10-CM | POA: Diagnosis not present

## 2023-01-23 DIAGNOSIS — R82998 Other abnormal findings in urine: Secondary | ICD-10-CM | POA: Diagnosis not present

## 2023-01-23 DIAGNOSIS — Z01818 Encounter for other preprocedural examination: Secondary | ICD-10-CM | POA: Diagnosis not present

## 2023-01-23 DIAGNOSIS — M8580 Other specified disorders of bone density and structure, unspecified site: Secondary | ICD-10-CM | POA: Diagnosis not present

## 2023-01-23 DIAGNOSIS — Z0000 Encounter for general adult medical examination without abnormal findings: Secondary | ICD-10-CM | POA: Diagnosis not present

## 2023-01-24 DIAGNOSIS — Z1231 Encounter for screening mammogram for malignant neoplasm of breast: Secondary | ICD-10-CM | POA: Diagnosis not present

## 2023-08-16 ENCOUNTER — Other Ambulatory Visit (HOSPITAL_COMMUNITY): Payer: Self-pay

## 2023-08-16 MED ORDER — NIRMATRELVIR&RITONAVIR 150/100 10 X 150 MG & 10 X 100MG PO TBPK
ORAL_TABLET | ORAL | 0 refills | Status: AC
  Filled 2023-08-16: qty 20, 5d supply, fill #0

## 2023-10-10 DIAGNOSIS — B353 Tinea pedis: Secondary | ICD-10-CM | POA: Diagnosis not present

## 2023-10-10 DIAGNOSIS — B351 Tinea unguium: Secondary | ICD-10-CM | POA: Diagnosis not present
